# Patient Record
Sex: Female | Born: 1970 | Race: White | Hispanic: No | Marital: Single | Smoking: Current every day smoker
Health system: Southern US, Community
[De-identification: ages and names within clinical notes are randomized; demographics above are authoritative.]

## PROBLEM LIST (undated history)

## (undated) DIAGNOSIS — T7840XA Allergy, unspecified, initial encounter: Secondary | ICD-10-CM

## (undated) DIAGNOSIS — F32A Depression, unspecified: Secondary | ICD-10-CM

## (undated) DIAGNOSIS — K219 Gastro-esophageal reflux disease without esophagitis: Secondary | ICD-10-CM

## (undated) DIAGNOSIS — M1990 Unspecified osteoarthritis, unspecified site: Secondary | ICD-10-CM

## (undated) DIAGNOSIS — E119 Type 2 diabetes mellitus without complications: Secondary | ICD-10-CM

## (undated) DIAGNOSIS — G4730 Sleep apnea, unspecified: Secondary | ICD-10-CM

## (undated) DIAGNOSIS — I10 Essential (primary) hypertension: Secondary | ICD-10-CM

## (undated) DIAGNOSIS — F419 Anxiety disorder, unspecified: Secondary | ICD-10-CM

## (undated) DIAGNOSIS — E785 Hyperlipidemia, unspecified: Secondary | ICD-10-CM

## (undated) DIAGNOSIS — F329 Major depressive disorder, single episode, unspecified: Secondary | ICD-10-CM

## (undated) HISTORY — DX: Gastro-esophageal reflux disease without esophagitis: K21.9

## (undated) HISTORY — DX: Essential (primary) hypertension: I10

## (undated) HISTORY — DX: Depression, unspecified: F32.A

## (undated) HISTORY — DX: Allergy, unspecified, initial encounter: T78.40XA

## (undated) HISTORY — DX: Type 2 diabetes mellitus without complications: E11.9

## (undated) HISTORY — DX: Anxiety disorder, unspecified: F41.9

## (undated) HISTORY — DX: Hyperlipidemia, unspecified: E78.5

## (undated) HISTORY — DX: Unspecified osteoarthritis, unspecified site: M19.90

## (undated) HISTORY — DX: Sleep apnea, unspecified: G47.30

## (undated) HISTORY — PX: HERNIA REPAIR: SHX51

## (undated) HISTORY — DX: Major depressive disorder, single episode, unspecified: F32.9

---

## 2002-11-30 HISTORY — PX: ABDOMINAL HYSTERECTOMY: SHX81

## 2004-09-19 ENCOUNTER — Other Ambulatory Visit: Payer: Self-pay

## 2004-09-30 ENCOUNTER — Inpatient Hospital Stay: Payer: Self-pay | Admitting: Unknown Physician Specialty

## 2005-04-29 ENCOUNTER — Emergency Department: Payer: Self-pay | Admitting: Emergency Medicine

## 2007-11-04 ENCOUNTER — Emergency Department: Payer: Self-pay | Admitting: Internal Medicine

## 2007-11-04 ENCOUNTER — Other Ambulatory Visit: Payer: Self-pay

## 2007-12-01 HISTORY — PX: CHOLECYSTECTOMY: SHX55

## 2009-07-12 ENCOUNTER — Ambulatory Visit: Payer: Self-pay | Admitting: Family Medicine

## 2011-10-12 ENCOUNTER — Ambulatory Visit: Payer: Self-pay | Admitting: Family Medicine

## 2012-06-22 ENCOUNTER — Ambulatory Visit: Payer: Self-pay | Admitting: Specialist

## 2012-06-22 DIAGNOSIS — I10 Essential (primary) hypertension: Secondary | ICD-10-CM

## 2012-06-22 LAB — BILIRUBIN, DIRECT: Bilirubin, Direct: <0.1 mg/dL (ref 0.00–0.20)

## 2012-06-22 LAB — CBC WITH DIFFERENTIAL/PLATELET
Basophil #: 0 10*3/uL (ref 0.0–0.1)
Eosinophil %: 1.5 %
Lymphocyte #: 2 10*3/uL (ref 1.0–3.6)
Lymphocyte %: 43.6 %
MCV: 95 fL (ref 80–100)
Monocyte #: 0.3 x10 3/mm (ref 0.2–0.9)
Monocyte %: 7 %
Neutrophil %: 47.3 %
Platelet: 242 10*3/uL (ref 150–440)
RBC: 4.6 10*6/uL (ref 3.80–5.20)
RDW: 12.4 % (ref 11.5–14.5)
WBC: 4.7 10*3/uL (ref 3.6–11.0)

## 2012-06-22 LAB — COMPREHENSIVE METABOLIC PANEL
Albumin: 3.9 g/dL (ref 3.4–5.0)
Alkaline Phosphatase: 139 U/L — ABNORMAL HIGH (ref 50–136)
Anion Gap: 6 — ABNORMAL LOW (ref 7–16)
BUN: 8 mg/dL (ref 7–18)
Bilirubin,Total: 0.4 mg/dL (ref 0.2–1.0)
Calcium, Total: 9 mg/dL (ref 8.5–10.1)
Chloride: 108 mmol/L — ABNORMAL HIGH (ref 98–107)
Glucose: 104 mg/dL — ABNORMAL HIGH (ref 65–99)
Osmolality: 282 (ref 275–301)
Potassium: 3.8 mmol/L (ref 3.5–5.1)
SGOT(AST): 31 U/L (ref 15–37)
Sodium: 142 mmol/L (ref 136–145)
Total Protein: 7.4 g/dL (ref 6.4–8.2)

## 2012-06-22 LAB — LIPASE, BLOOD: Lipase: 193 U/L (ref 73–393)

## 2012-06-22 LAB — TSH: Thyroid Stimulating Horm: 1.33 u[IU]/mL

## 2012-06-22 LAB — FERRITIN: Ferritin (ARMC): 43 ng/mL (ref 8–388)

## 2012-06-22 LAB — PROTIME-INR
INR: 0.9
Prothrombin Time: 12.4 secs (ref 11.5–14.7)

## 2012-06-22 LAB — IRON AND TIBC
Iron Saturation: 31 %
Iron: 115 ug/dL (ref 50–170)

## 2012-06-22 LAB — HEMOGLOBIN A1C: Hemoglobin A1C: 6.4 % — ABNORMAL HIGH (ref 4.2–6.3)

## 2012-06-22 LAB — APTT: Activated PTT: 27.8 secs (ref 23.6–35.9)

## 2012-06-22 LAB — PHOSPHORUS: Phosphorus: 3.8 mg/dL (ref 2.5–4.9)

## 2012-06-29 ENCOUNTER — Ambulatory Visit: Payer: Self-pay | Admitting: Bariatrics

## 2012-06-30 ENCOUNTER — Ambulatory Visit: Payer: Self-pay | Admitting: Bariatrics

## 2012-07-26 ENCOUNTER — Ambulatory Visit: Payer: Self-pay | Admitting: Specialist

## 2012-08-02 ENCOUNTER — Inpatient Hospital Stay: Payer: Self-pay | Admitting: Bariatrics

## 2012-08-02 HISTORY — PX: SLEEVE GASTROPLASTY: SHX1101

## 2012-08-03 LAB — CBC WITH DIFFERENTIAL/PLATELET
Basophil #: 0 10*3/uL (ref 0.0–0.1)
Basophil %: 0.1 %
Eosinophil #: 0 10*3/uL (ref 0.0–0.7)
Eosinophil %: 0.2 %
HCT: 38.2 % (ref 35.0–47.0)
Lymphocyte #: 2 10*3/uL (ref 1.0–3.6)
Lymphocyte %: 17.8 %
MCV: 93 fL (ref 80–100)
Neutrophil #: 8.6 10*3/uL — ABNORMAL HIGH (ref 1.4–6.5)
Neutrophil %: 74.5 %
RBC: 4.1 10*6/uL (ref 3.80–5.20)
WBC: 11.5 10*3/uL — ABNORMAL HIGH (ref 3.6–11.0)

## 2012-08-03 LAB — MAGNESIUM: Magnesium: 2 mg/dL

## 2012-08-03 LAB — BASIC METABOLIC PANEL
Anion Gap: 5 — ABNORMAL LOW (ref 7–16)
BUN: 9 mg/dL (ref 7–18)
Chloride: 105 mmol/L (ref 98–107)
Creatinine: 1.04 mg/dL (ref 0.60–1.30)
EGFR (African American): >60
EGFR (Non-African Amer.): >60
Glucose: 115 mg/dL — ABNORMAL HIGH (ref 65–99)
Osmolality: 277 (ref 275–301)

## 2012-08-03 LAB — PHOSPHORUS: Phosphorus: 4.3 mg/dL (ref 2.5–4.9)

## 2012-08-25 ENCOUNTER — Ambulatory Visit: Payer: Self-pay | Admitting: Specialist

## 2012-08-30 ENCOUNTER — Ambulatory Visit: Payer: Self-pay | Admitting: Specialist

## 2012-10-20 ENCOUNTER — Ambulatory Visit: Payer: Self-pay

## 2012-10-30 ENCOUNTER — Ambulatory Visit: Payer: Self-pay | Admitting: Bariatrics

## 2012-11-14 ENCOUNTER — Ambulatory Visit: Payer: Self-pay | Admitting: Family Medicine

## 2013-09-01 IMAGING — CR DG CHEST 2V
1 series · 2 of 2 positions shown · non-contrast
Comparison: none

REASON FOR EXAM: Hypertension, hyperlipidemia, morbid obesity, insomnia,
depression
COMMENTS:

PROCEDURE:     DXR - DXR CHEST PA (OR AP) AND LATERAL  - June 22, 2012  [DATE]
RESULT:     Comparison: None

[Series 1: w chest pa · 0.14mm/px · 2 of 2 slices shown]
[im 1/2]
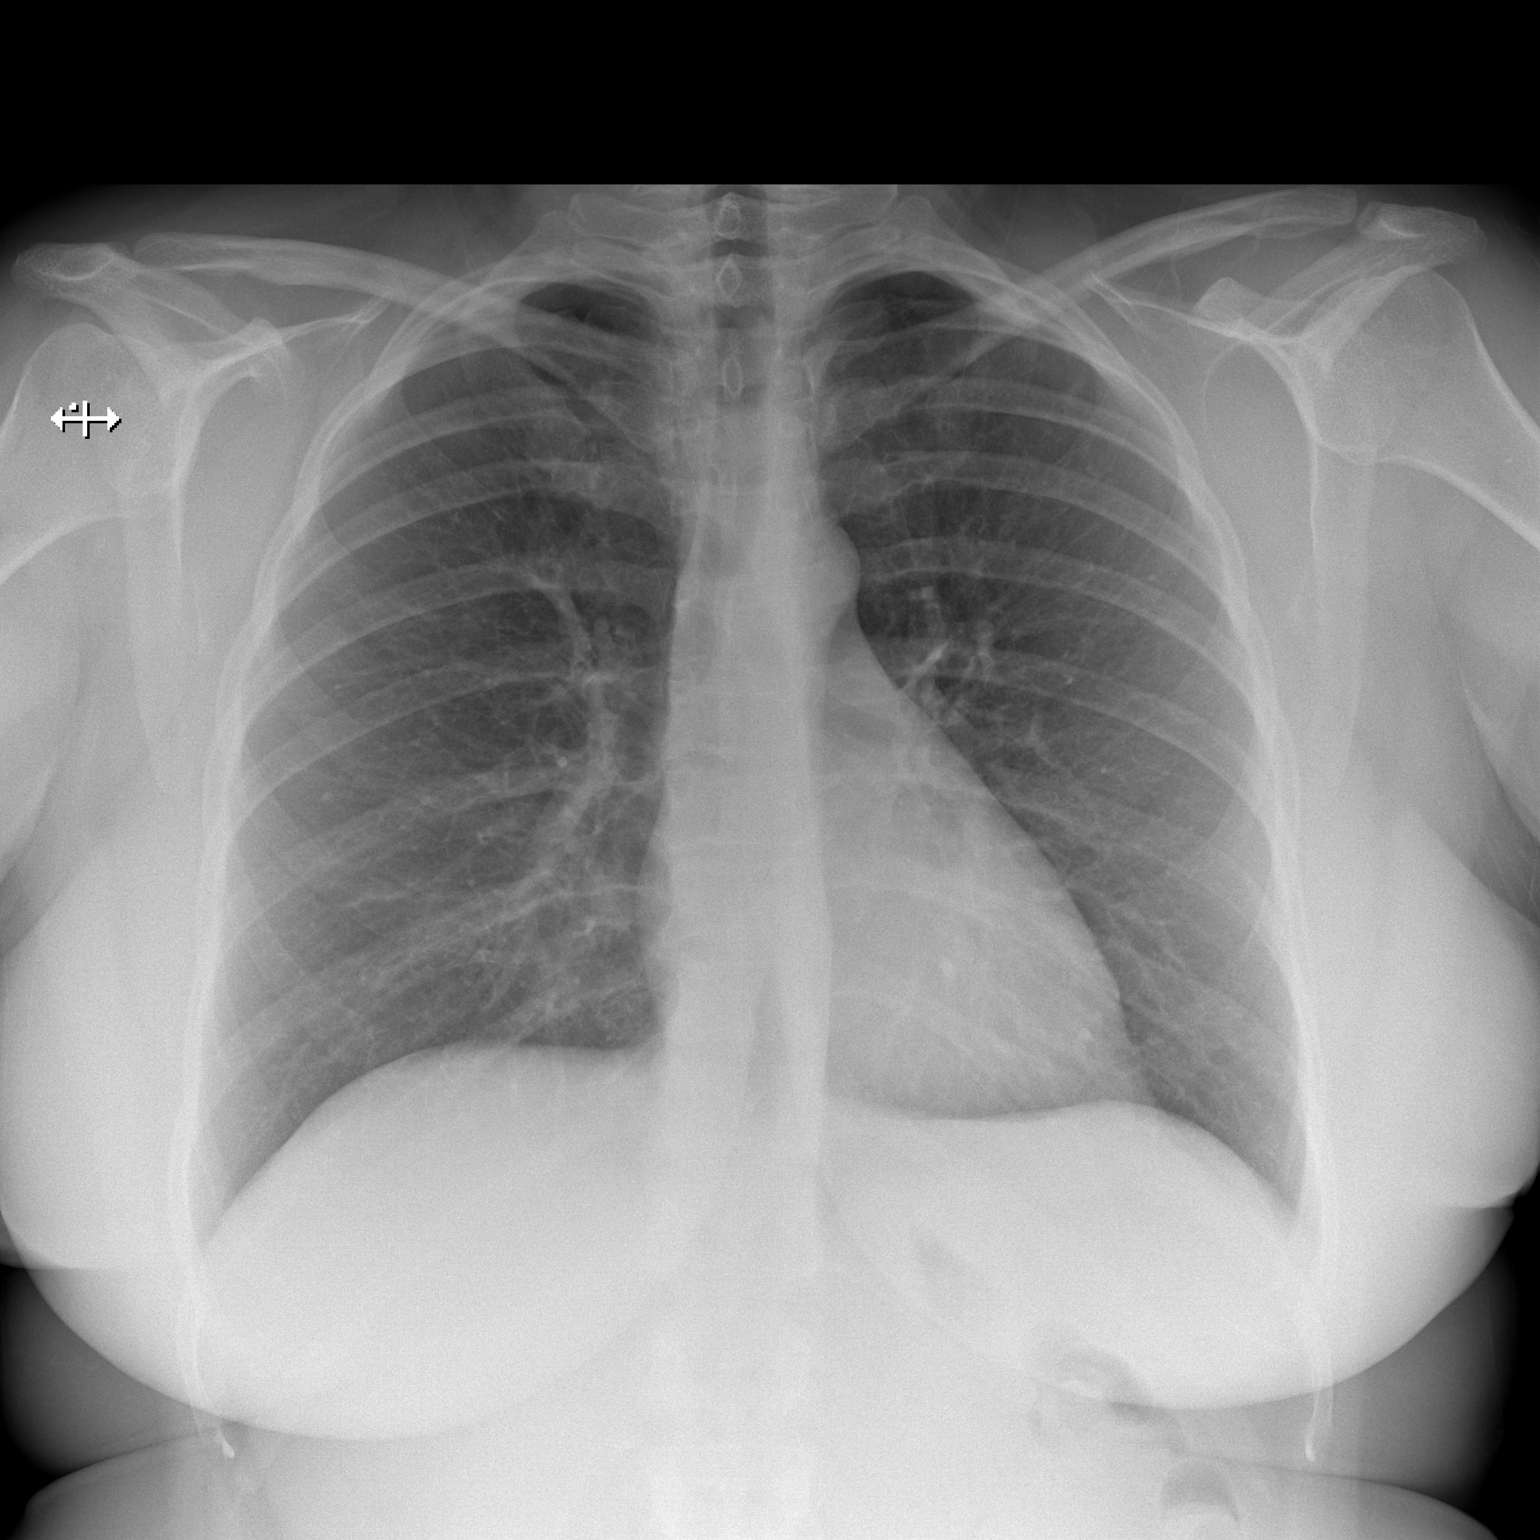
[im 2/2]
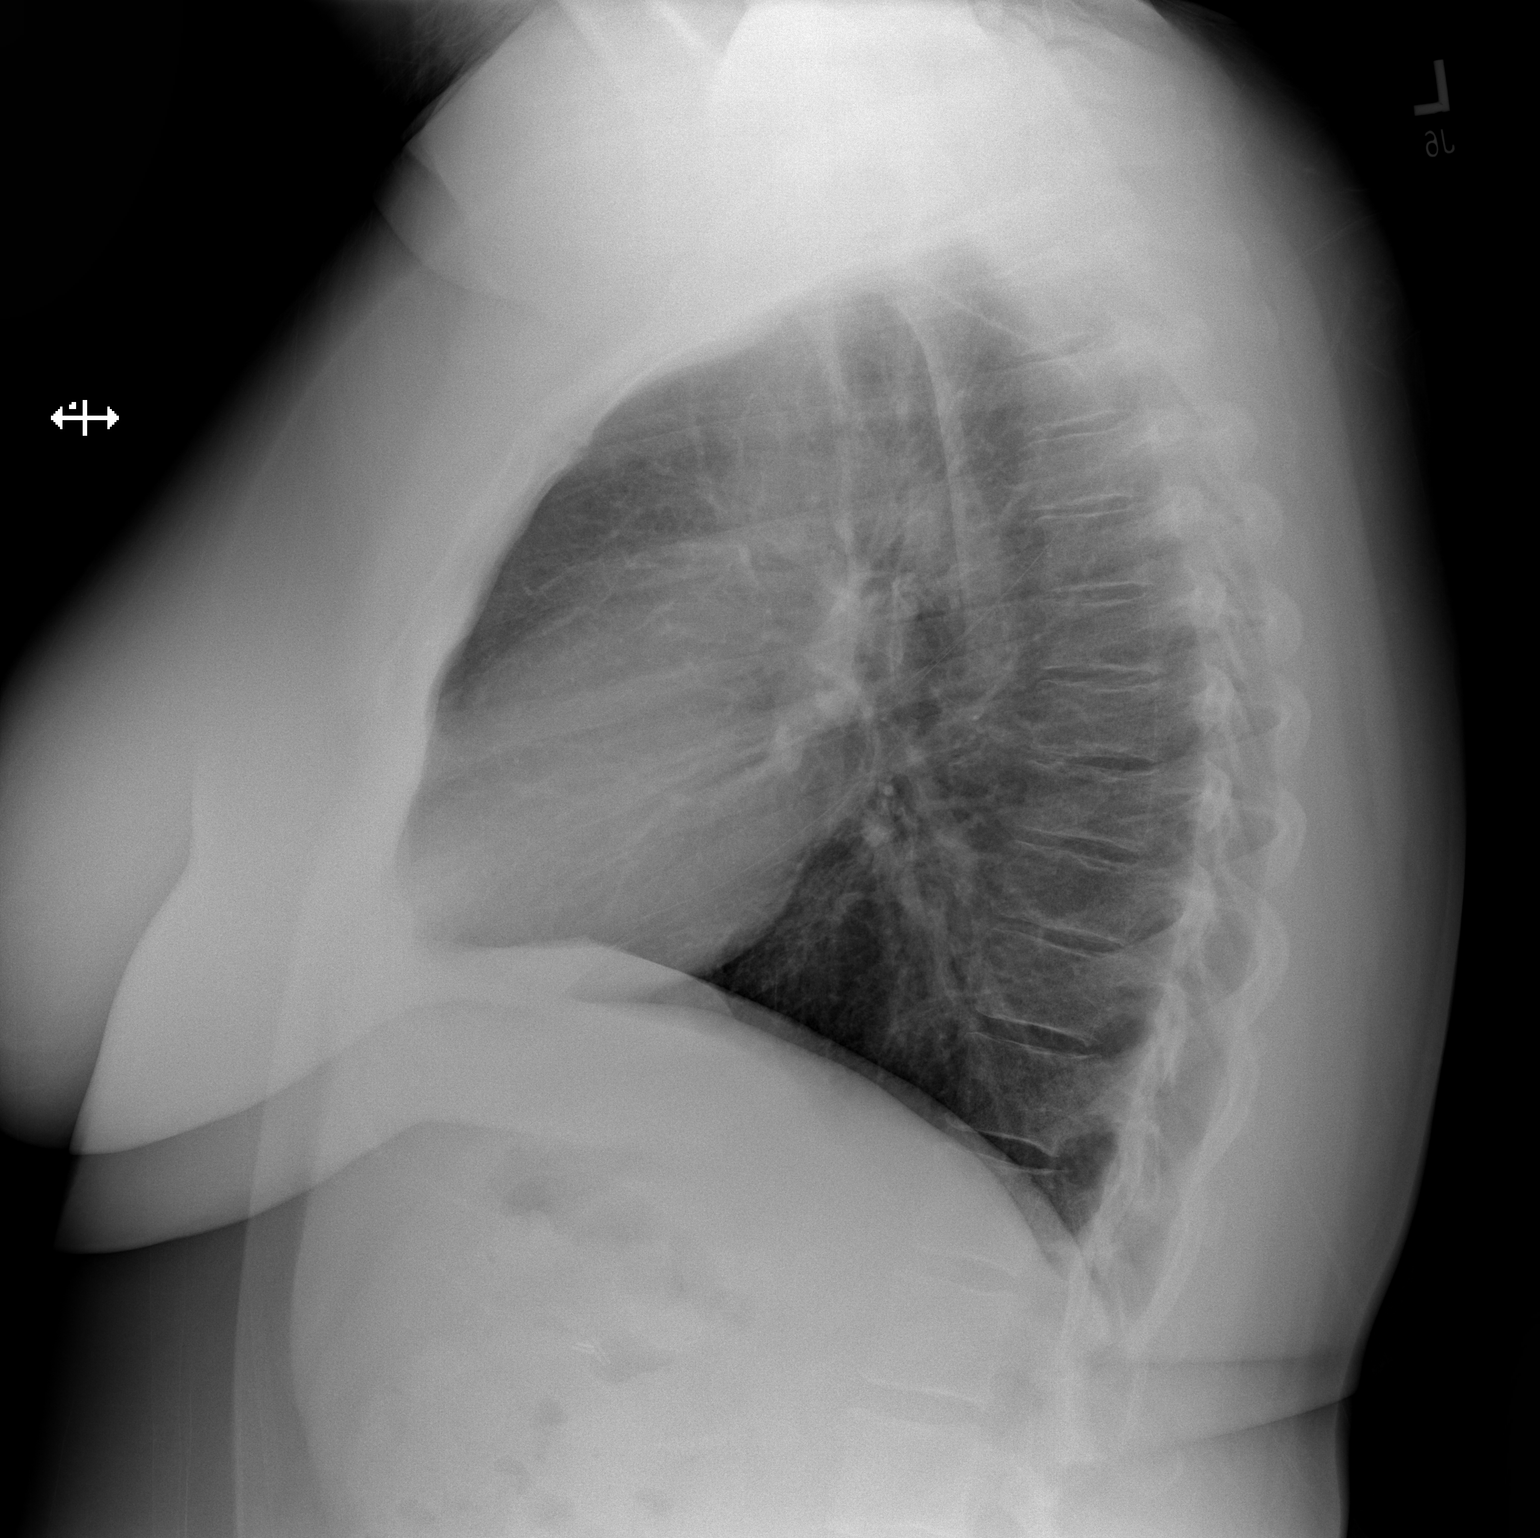

[2 of 2 positions shown; findings below may reference images not displayed]

FINDINGS: PA and lateral chest radiographs are provided.  There is no focal
parenchymal opacity, pleural effusion, or pneumothorax. The heart and
mediastinum are unremarkable.  The osseous structures are unremarkable.
IMPRESSION: No acute disease of the che[REDACTED]

## 2014-02-21 ENCOUNTER — Ambulatory Visit: Payer: BC Managed Care – PPO | Admitting: Adult Health

## 2014-02-26 ENCOUNTER — Encounter: Payer: Self-pay | Admitting: Family Medicine

## 2014-02-26 ENCOUNTER — Ambulatory Visit (INDEPENDENT_AMBULATORY_CARE_PROVIDER_SITE_OTHER): Payer: BC Managed Care – PPO | Admitting: Family Medicine

## 2014-02-26 VITALS — BP 112/70 | HR 79 | Temp 97.7°F | Ht 66.5 in | Wt 228.8 lb

## 2014-02-26 DIAGNOSIS — E119 Type 2 diabetes mellitus without complications: Secondary | ICD-10-CM | POA: Insufficient documentation

## 2014-02-26 DIAGNOSIS — E1169 Type 2 diabetes mellitus with other specified complication: Secondary | ICD-10-CM | POA: Insufficient documentation

## 2014-02-26 DIAGNOSIS — I10 Essential (primary) hypertension: Secondary | ICD-10-CM

## 2014-02-26 DIAGNOSIS — F418 Other specified anxiety disorders: Secondary | ICD-10-CM

## 2014-02-26 DIAGNOSIS — R7309 Other abnormal glucose: Secondary | ICD-10-CM

## 2014-02-26 DIAGNOSIS — K219 Gastro-esophageal reflux disease without esophagitis: Secondary | ICD-10-CM

## 2014-02-26 DIAGNOSIS — Z23 Encounter for immunization: Secondary | ICD-10-CM

## 2014-02-26 DIAGNOSIS — F341 Dysthymic disorder: Secondary | ICD-10-CM

## 2014-02-26 DIAGNOSIS — F17200 Nicotine dependence, unspecified, uncomplicated: Secondary | ICD-10-CM | POA: Insufficient documentation

## 2014-02-26 DIAGNOSIS — R7303 Prediabetes: Secondary | ICD-10-CM | POA: Insufficient documentation

## 2014-02-26 DIAGNOSIS — R739 Hyperglycemia, unspecified: Secondary | ICD-10-CM

## 2014-02-26 DIAGNOSIS — E785 Hyperlipidemia, unspecified: Secondary | ICD-10-CM

## 2014-02-26 MED ORDER — RANITIDINE HCL 150 MG PO CAPS: 150.0000 mg | ORAL_CAPSULE | Freq: Two times a day (BID) | ORAL | Status: DC

## 2014-02-26 MED ORDER — ESCITALOPRAM OXALATE 20 MG PO TABS: 20.0000 mg | ORAL_TABLET | Freq: Every day | ORAL | Status: DC

## 2014-02-26 MED ORDER — CLONAZEPAM 1 MG PO TABS: 1.0000 mg | ORAL_TABLET | Freq: Two times a day (BID) | ORAL | Status: DC | PRN

## 2014-02-26 NOTE — Assessment & Plan Note (Signed)
Last lipids reviewed with pt  Well controlled with statin and diet Rev low sat fat diet Lab and f/u in the fall

## 2014-02-26 NOTE — Progress Notes (Signed)
Subjective:    Patient ID: Rebekah Benton, female    DOB: October 20, 1971, 43 y.o.   MRN: [id number]  HPI Here to get est as a new pt   Was seen by Dr Maryjane Hurter at [facility] in the past  Had bariatric surgery - sleeve gastrectomy  Did not loose as much as she hoped  She lost 40 lb in 2 years-not as much as she had hoped -now wishes she had done the bypass  She goes to [employer] for exercise   (she has not gone for about a month due to sleeplessness)  Eats healthy   Probably gained 15 lb since not going   Is having problems with sleep  This is relatively new for her  ? What changed -was in a good rhythm in the past  Has a 8 year old daughter- stressor  Son is deployed - is a Insurance risk surveyor  Works full time  Also school on line  She tends to stay tense  She also grinds her teeth at night   Has hx of depression and anxiety and has been on celexa and clonazepam  ? If poss panic disease  In the past lexapro worked well for her but then she could not afford it  She thinks they work well  If she misses a dose - gets anxiety and depression/ foul mood and a lot of worry with irritability    Hx of DM-does not currently have DM  A1C is 6.0   cmet nl also   Cholesterol  167     (at her very highest was much over 200)  Trig 100 HDL 55.3  LDL 91.7  Overall very good profile On lipitor and diet   bp is stable today  No cp or palpitations or headaches or edema  No side effects to medicines  BP Readings from Last 3 Encounters:  02/26/14 112/70       Had a hysterectomy in the past - due to very bad peroids  A year later had oophrectomy -had a lot of cysts on them   Had a mammogram in 10/13  Was normal  She goes to [facility] Not ready to quit No lung problems  Does not get flu shots   Patient Active Problem List   Diagnosis Date Noted  . Depression with anxiety 02/26/2014  . Hyperglycemia 02/26/2014  . Essential hypertension, benign  02/26/2014  . Hyperlipidemia 02/26/2014  . Smoking 02/26/2014  . GERD (gastroesophageal reflux disease) 02/26/2014   Past Medical History  Diagnosis Date  . Depression   . Diabetes mellitus without complication   . Hypertension   . Hyperlipidemia   . GERD (gastroesophageal reflux disease)    Past Surgical History  Procedure Laterality Date  . Abdominal hysterectomy  2004  . Cholecystectomy  2009  . Sleeve gastroplasty  08/02/2012   History  Substance Use Topics  . Smoking status: Current Every Day Smoker  . Smokeless tobacco: Never Used  . Alcohol Use: No   Family History  Problem Relation Age of Onset  . Cancer Father     Prostate  . Diabetes Father    Allergies  Allergen Reactions  . Penicillins Rash   No current outpatient prescriptions on file prior to visit.   No current facility-administered medications on file prior to visit.    Review of Systems Review of Systems  Constitutional: Negative for fever, appetite change, fatigue and unexpected weight change.  Eyes: Negative for  pain and visual disturbance.  Respiratory: Negative for cough and shortness of breath.   Cardiovascular: Negative for cp or palpitations    Gastrointestinal: Negative for nausea, diarrhea and constipation.  Genitourinary: Negative for urgency and frequency.  Skin: Negative for pallor or rash   Neurological: Negative for weakness, light-headedness, numbness and headaches.  Hematological: Negative for adenopathy. Does not bruise/bleed easily.  Psychiatric/Behavioral: Negative for dysphoric mood. The patient is not nervous/anxious.  pos for stressors and also difficulty obt and mt sleep       Objective:   Physical Exam  Constitutional: She appears well-developed and well-nourished. No distress.  obese and well appearing   HENT:  Head: Normocephalic and atraumatic.  Right Ear: External ear normal.  Left Ear: External ear normal.  Nose: Nose normal.  Mouth/Throat: Oropharynx is clear  and moist.  Eyes: Conjunctivae and EOM are normal. Pupils are equal, round, and reactive to light. Right eye exhibits no discharge. Left eye exhibits no discharge. No scleral icterus.  Neck: Normal range of motion. Neck supple. No JVD present. No thyromegaly present.  Cardiovascular: Normal rate, regular rhythm, normal heart sounds and intact distal pulses.  Exam reveals no gallop.   Pulmonary/Chest: Effort normal and breath sounds normal. No respiratory distress. She has no wheezes. She has no rales.  Abdominal: Soft. Bowel sounds are normal. She exhibits no distension and no mass. There is no tenderness.  Musculoskeletal: She exhibits no edema and no tenderness.  Lymphadenopathy:    She has no cervical adenopathy.  Neurological: She is alert. She has normal reflexes. No cranial nerve deficit. She exhibits normal muscle tone. Coordination normal.  Skin: Skin is warm and dry. No rash noted. No erythema. No pallor.  Ruddy complexion with lentigos   Psychiatric: She has a normal mood and affect.  Pleasant and attentive today           Assessment & Plan:

## 2014-02-26 NOTE — Patient Instructions (Signed)
[facility] and see if you can afford the generic  If so -stop the celexa generic and change to lexapro generic Try ranitidine for your acid reflux  My hope- you will sleep better with lexapro  If you want a counseling referral in the future -please call and let me know  Don't forget to schedule your own mammogram Keep thinking about quitting smoking  Tetanus shot today    Schedule annual exam in the fall (Sept) with labs prior

## 2014-02-26 NOTE — Assessment & Plan Note (Signed)
Prev DM before bariatric surg Re check A1C in fall  6.0 last check Rev low glycemic diet/ goals for wt loss and exercise

## 2014-02-26 NOTE — Assessment & Plan Note (Signed)
bp in fair control at this time  BP Readings from Last 1 Encounters:  02/26/14 112/70   No changes needed Disc lifstyle change with low sodium diet and exercise   [facility] the [facility] F/u fall for PE with labs

## 2014-02-26 NOTE — Progress Notes (Signed)
Pre visit review using our clinic review tool, if applicable. No additional management support is needed unless otherwise documented below in the visit note. 

## 2014-02-26 NOTE — Assessment & Plan Note (Signed)
Disc in detail risks of smoking and possible outcomes including copd, vascular/ heart disease, cancer , respiratory and sinus infections  Pt voices understanding She states she is not ready to quit yet- disc opt for help when she is

## 2014-02-26 NOTE — Assessment & Plan Note (Signed)
While this is fairly controlled - she has many stressors and trouble sleeping Reviewed stressors/ coping techniques/symptoms/ support sources/ tx options and side effects in detail today  Pt did better on lexapro in the past - will see if the generic is affordable for her now and switch from celexa if she can Disc sleep hygiene and need for exercise Also caffeine avoidance  F/u planned for fall- earlier if needed

## 2014-02-26 NOTE — Assessment & Plan Note (Signed)
Worse lately -? With recent wt re gain Also a bariatric surgery pt  Disc gerd diet  Omeprazole does not work well for her  Will try ranitidine 150 bid and update

## 2014-07-10 ENCOUNTER — Other Ambulatory Visit: Payer: Self-pay | Admitting: *Deleted

## 2014-07-10 NOTE — Telephone Encounter (Signed)
Fax refill request, not on med list, please advise  

## 2014-07-10 NOTE — Telephone Encounter (Signed)
Please clarify with pt that she is taking this

## 2014-07-13 ENCOUNTER — Encounter: Payer: Self-pay | Admitting: Radiology

## 2014-07-13 ENCOUNTER — Ambulatory Visit (INDEPENDENT_AMBULATORY_CARE_PROVIDER_SITE_OTHER)
Admission: RE | Admit: 2014-07-13 | Discharge: 2014-07-13 | Disposition: A | Discharge status: Home / Self Care | Payer: BC Managed Care – PPO | Source: Ambulatory Visit | Attending: Family Medicine | Admitting: Family Medicine

## 2014-07-13 ENCOUNTER — Ambulatory Visit (INDEPENDENT_AMBULATORY_CARE_PROVIDER_SITE_OTHER): Payer: BC Managed Care – PPO | Admitting: Family Medicine

## 2014-07-13 ENCOUNTER — Encounter: Payer: Self-pay | Admitting: Family Medicine

## 2014-07-13 ENCOUNTER — Other Ambulatory Visit: Payer: Self-pay | Admitting: *Deleted

## 2014-07-13 VITALS — BP 122/88 | HR 80 | Temp 98.4°F | Ht 66.5 in | Wt 235.8 lb

## 2014-07-13 DIAGNOSIS — M25569 Pain in unspecified knee: Secondary | ICD-10-CM

## 2014-07-13 DIAGNOSIS — M25561 Pain in right knee: Secondary | ICD-10-CM | POA: Insufficient documentation

## 2014-07-13 DIAGNOSIS — M79671 Pain in right foot: Secondary | ICD-10-CM

## 2014-07-13 DIAGNOSIS — M79609 Pain in unspecified limb: Secondary | ICD-10-CM

## 2014-07-13 DIAGNOSIS — M25579 Pain in unspecified ankle and joints of unspecified foot: Secondary | ICD-10-CM

## 2014-07-13 DIAGNOSIS — M25571 Pain in right ankle and joints of right foot: Secondary | ICD-10-CM

## 2014-07-13 NOTE — Progress Notes (Signed)
Subjective:    Patient ID: Rebekah Benton, female    DOB: 1971/08/01, 43 y.o.   MRN: [id number]  HPI Here with pain in her R foot and knee  Going on for months  No injury known   Usually works out and walks (does some sprints on a track) Has not walked all week due to the pain  Coming home - putting ice on her foot and knee  Cannot get comfortable   She wonders about arthritis in knees   (other knee hurts a bit now)   Foot hurts laterally It swells at times  Very little brusing if any  Will ache and radiate up leg  Knee hurts over the patellar tendon and to both sides  Bending hurts (squats are the worst)   Ibuprofen helped once   Patient Active Problem List   Diagnosis Date Noted  . Depression with anxiety 02/26/2014  . Hyperglycemia 02/26/2014  . Essential hypertension, benign 02/26/2014  . Hyperlipidemia 02/26/2014  . Smoking 02/26/2014  . GERD (gastroesophageal reflux disease) 02/26/2014   Past Medical History  Diagnosis Date  . Depression   . Diabetes mellitus without complication   . Hypertension   . Hyperlipidemia   . GERD (gastroesophageal reflux disease)    Past Surgical History  Procedure Laterality Date  . Abdominal hysterectomy  2004  . Cholecystectomy  2009  . Sleeve gastroplasty  08/02/2012   History  Substance Use Topics  . Smoking status: Current Every Day Smoker -- 0.50 packs/day    Types: Cigarettes  . Smokeless tobacco: Never Used  . Alcohol Use: No   Family History  Problem Relation Age of Onset  . Cancer Father     Prostate  . Diabetes Father    Allergies  Allergen Reactions  . Penicillins Rash   Current Outpatient Prescriptions on File Prior to Visit  Medication Sig Dispense Refill  . atorvastatin (LIPITOR) 40 MG tablet Take 40 mg by mouth daily.      . citalopram (CELEXA) 40 MG tablet Take 40 mg by mouth daily.      . clonazePAM (KLONOPIN) 1 MG tablet Take 1 tablet (1 mg total) by mouth 2 (two) times daily as needed for  anxiety.  60 tablet  1  . escitalopram (LEXAPRO) 20 MG tablet Take 1 tablet (20 mg total) by mouth daily.  30 tablet  11  . lisinopril (PRINIVIL,ZESTRIL) 10 MG tablet Take 10 mg by mouth daily.      . metoprolol (LOPRESSOR) 50 MG tablet Take 50 mg by mouth daily.      . ranitidine (ZANTAC) 150 MG capsule Take 1 capsule (150 mg total) by mouth 2 (two) times daily.  60 capsule  11   No current facility-administered medications on file prior to visit.    Review of Systems Review of Systems  Constitutional: Negative for fever, appetite change, fatigue and unexpected weight change.  Eyes: Negative for pain and visual disturbance.  Respiratory: Negative for cough and shortness of breath.   Cardiovascular: Negative for cp or palpitations    Gastrointestinal: Negative for nausea, diarrhea and constipation.  Genitourinary: Negative for urgency and frequency.  Skin: Negative for pallor or rash  MSK pos for foot/ankle and knee pain   Neurological: Negative for weakness, light-headedness, numbness and headaches.  Hematological: Negative for adenopathy. Does not bruise/bleed easily.  Psychiatric/Behavioral: Negative for dysphoric mood. The patient is not nervous/anxious.         Objective:   Physical Exam  Constitutional: She appears well-developed and well-nourished. No distress.  obese and well appearing   HENT:  Head: Normocephalic and atraumatic.  Neck: Normal range of motion. Neck supple.  Cardiovascular: Normal rate and regular rhythm.   Pulmonary/Chest: Effort normal and breath sounds normal.  Musculoskeletal: She exhibits edema and tenderness.       Right knee: She exhibits decreased range of motion and swelling. She exhibits no effusion, no ecchymosis, no erythema, normal patellar mobility, no bony tenderness and no MCL laxity. Tenderness found. Medial joint line and patellar tendon tenderness noted.       Right ankle: She exhibits decreased range of motion and swelling. She exhibits  no ecchymosis, no deformity and normal pulse. Tenderness. Lateral malleolus and posterior TFL tenderness found. No head of 5th metatarsal and no proximal fibula tenderness found. Achilles tendon normal.       Left foot: She exhibits tenderness, bony tenderness and swelling. She exhibits normal range of motion, no crepitus and no deformity.  R foot - lateral tenderness under malleolus  slt swelling  Pain to fully dorsiflex foot and to internally rotate it  Neg talar tilt  R knee-pain to flex over 90 deg  Pain on mcmurray test  Nl extension    Neurological: She is alert. She has normal reflexes. She displays no atrophy. No sensory deficit. She exhibits normal muscle tone.  Skin: Skin is warm and dry. No rash noted.  Psychiatric: She has a normal mood and affect.          Assessment & Plan:   Problem List Items Addressed This Visit     Other   Right foot pain - Primary     Lateral Also gait change due to knee inj Suspect overuse  Want to r/o stress fx (smoker/ obese) Xray today Aleve prn Ice and elevated Then make plan    Relevant Orders      DG Foot Complete Right (Completed)      DG Ankle Complete Right (Completed)   Right knee pain     Patellar tendon and medial joint line Suspect overuse injury in overwt female Will get a patellar stabilizing knee sleeve  Ice and elevation  Aleve prn  Xray today and then make plan    Relevant Orders      DG Knee Complete 4 Views Right (Completed)   Right ankle pain     Medial- with medial foot pain  Suspect overuse injury (also knee pain and obesity)  Cannot r/o stress fx  Xray today Aleve - 1 pill bid prn  Elevate/ice     Relevant Orders      DG Foot Complete Right (Completed)      DG Ankle Complete Right (Completed)

## 2014-07-13 NOTE — Assessment & Plan Note (Signed)
Lateral Also gait change due to knee inj Suspect overuse  Want to r/o stress fx (smoker/ obese) Xray today Aleve prn Ice and elevated Then make plan

## 2014-07-13 NOTE — Progress Notes (Signed)
Pre visit review using our clinic review tool, if applicable. No additional management support is needed unless otherwise documented below in the visit note. 

## 2014-07-13 NOTE — Assessment & Plan Note (Signed)
Medial- with medial foot pain  Suspect overuse injury (also knee pain and obesity)  Cannot r/o stress fx  Xray today Aleve - 1 pill bid prn  Elevate/ice

## 2014-07-13 NOTE — Patient Instructions (Signed)
Continue to use ice on knee and ankle whenever you can (and elevate it ) Avoid running or other things that make it hurt  Bike or swimming is best  xrays today  Try aleve 1 pill with food twice daily for pain and inflammation Get a knee sleeve (the kind with hole in the middle)- for support while active  Will make plan based on xray report

## 2014-07-13 NOTE — Assessment & Plan Note (Signed)
Patellar tendon and medial joint line Suspect overuse injury in overwt female Will get a patellar stabilizing knee sleeve  Ice and elevation  Aleve prn  Xray today and then make plan

## 2014-07-16 ENCOUNTER — Telehealth: Payer: Self-pay | Admitting: Family Medicine

## 2014-07-16 NOTE — Telephone Encounter (Signed)
Appt scheduled 08/16/14 with Dr. Patsy Lager.

## 2014-07-20 ENCOUNTER — Telehealth: Payer: Self-pay | Admitting: *Deleted

## 2014-07-20 NOTE — Telephone Encounter (Signed)
Please call pt to clarify this

## 2014-07-20 NOTE — Telephone Encounter (Signed)
Received request for lasix 20 mg 1 QD. Not on current med list. Previously prescribed [facility]aus[facility] [place]

## 2014-07-25 ENCOUNTER — Encounter: Payer: Self-pay | Admitting: *Deleted

## 2014-07-27 NOTE — Telephone Encounter (Signed)
Called pt an no answer and no voicemail set up

## 2014-08-02 MED ORDER — FUROSEMIDE 20 MG PO TABS: 20.0000 mg | ORAL_TABLET | Freq: Every day | ORAL | Status: DC

## 2014-08-02 NOTE — Telephone Encounter (Addendum)
Pt not on med when she was seen in office because she had ran out and didn't think she would need it anymore but she does, Rx done

## 2014-08-02 NOTE — Addendum Note (Signed)
Addended by: Shon Millet on: 08/02/2014 04:17 PM   Modules accepted: Orders

## 2014-08-07 ENCOUNTER — Encounter: Payer: Self-pay | Admitting: Family Medicine

## 2014-08-08 ENCOUNTER — Telehealth: Payer: Self-pay | Admitting: Family Medicine

## 2014-08-08 DIAGNOSIS — E785 Hyperlipidemia, unspecified: Secondary | ICD-10-CM

## 2014-08-08 DIAGNOSIS — I10 Essential (primary) hypertension: Secondary | ICD-10-CM

## 2014-08-08 DIAGNOSIS — R739 Hyperglycemia, unspecified: Secondary | ICD-10-CM

## 2014-08-08 NOTE — Telephone Encounter (Signed)
Pt was supposed to have labs and then PE scheduled -I do not see a PE appt

## 2014-08-09 ENCOUNTER — Encounter: Payer: Self-pay | Admitting: Family Medicine

## 2014-08-16 ENCOUNTER — Other Ambulatory Visit: Payer: BC Managed Care – PPO

## 2014-08-16 ENCOUNTER — Institutional Professional Consult (permissible substitution): Payer: BC Managed Care – PPO | Admitting: Family Medicine

## 2014-09-18 ENCOUNTER — Other Ambulatory Visit: Payer: Self-pay

## 2014-09-18 NOTE — Telephone Encounter (Signed)
Please schedule PE -winter or spring and refill until then

## 2014-09-18 NOTE — Telephone Encounter (Signed)
Pt left v/m;pt request refill atorvastatin, lisinopril and metoprolol to [facility] [address] today. Dr Milinda Antisower has never filled before and 08/08/14 phone note Dr Milinda Antisower noted pt has not scheduled her CPE.Please advise.Pt request cb when refilled.

## 2014-09-19 ENCOUNTER — Telehealth: Payer: Self-pay | Admitting: *Deleted

## 2014-09-19 DIAGNOSIS — R739 Hyperglycemia, unspecified: Secondary | ICD-10-CM

## 2014-09-19 DIAGNOSIS — I10 Essential (primary) hypertension: Secondary | ICD-10-CM

## 2014-09-19 DIAGNOSIS — E785 Hyperlipidemia, unspecified: Secondary | ICD-10-CM

## 2014-09-19 MED ORDER — ATORVASTATIN CALCIUM 40 MG PO TABS: 40.0000 mg | ORAL_TABLET | Freq: Every day | ORAL | Status: DC

## 2014-09-19 MED ORDER — LISINOPRIL 10 MG PO TABS: 10.0000 mg | ORAL_TABLET | Freq: Every day | ORAL | Status: DC

## 2014-09-19 MED ORDER — METOPROLOL TARTRATE 50 MG PO TABS: 50.0000 mg | ORAL_TABLET | Freq: Every day | ORAL | Status: DC

## 2014-09-19 NOTE — Telephone Encounter (Signed)
I will put in wellness orders

## 2014-09-19 NOTE — Telephone Encounter (Signed)
appt scheduled and med refilled 

## 2014-09-19 NOTE — Telephone Encounter (Signed)
I scheduled a CPE for pt because she needed her meds refilled and you advise me she would be due for CPE in spring. appt scheduled for March but pt wanted to know if she could her her labs done now, pt said you advise her she would be due for labs in fall, is it okay to schedule appt and if so please put in lab orders

## 2014-09-20 NOTE — Addendum Note (Signed)
Addended by: Alvina ChouWALSH, TERRI J on: 09/20/2014 08:58 AM   Modules accepted: Orders

## 2014-10-30 ENCOUNTER — Other Ambulatory Visit: Payer: Self-pay | Admitting: Family Medicine

## 2014-10-30 NOTE — Telephone Encounter (Signed)
Px written for call in   

## 2014-10-30 NOTE — Telephone Encounter (Signed)
Ok to refill 

## 2014-10-31 NOTE — Telephone Encounter (Signed)
Rx called in as prescribed 

## 2014-12-28 ENCOUNTER — Encounter: Payer: Self-pay | Admitting: Family Medicine

## 2015-02-08 ENCOUNTER — Other Ambulatory Visit (INDEPENDENT_AMBULATORY_CARE_PROVIDER_SITE_OTHER): Payer: BLUE CROSS/BLUE SHIELD

## 2015-02-08 DIAGNOSIS — R739 Hyperglycemia, unspecified: Secondary | ICD-10-CM

## 2015-02-08 DIAGNOSIS — I10 Essential (primary) hypertension: Secondary | ICD-10-CM

## 2015-02-08 DIAGNOSIS — E785 Hyperlipidemia, unspecified: Secondary | ICD-10-CM

## 2015-02-08 LAB — CBC WITH DIFFERENTIAL/PLATELET
Basophils Absolute: 0 10*3/uL (ref 0.0–0.1)
Basophils Relative: 0.5 % (ref 0.0–3.0)
EOS PCT: 2 % (ref 0.0–5.0)
Eosinophils Absolute: 0.1 10*3/uL (ref 0.0–0.7)
HCT: 42.6 % (ref 36.0–46.0)
Hemoglobin: 14.7 g/dL (ref 12.0–15.0)
Lymphocytes Relative: 38.5 % (ref 12.0–46.0)
Lymphs Abs: 2.3 10*3/uL (ref 0.7–4.0)
MCHC: 34.5 g/dL (ref 30.0–36.0)
MCV: 91.2 fl (ref 78.0–100.0)
MONO ABS: 0.4 10*3/uL (ref 0.1–1.0)
MONOS PCT: 7.2 % (ref 3.0–12.0)
NEUTROS PCT: 51.8 % (ref 43.0–77.0)
Neutro Abs: 3.1 10*3/uL (ref 1.4–7.7)
Platelets: 262 10*3/uL (ref 150.0–400.0)
RBC: 4.67 Mil/uL (ref 3.87–5.11)
RDW: 12.8 % (ref 11.5–15.5)
WBC: 5.9 10*3/uL (ref 4.0–10.5)

## 2015-02-08 LAB — COMPREHENSIVE METABOLIC PANEL
ALBUMIN: 4.3 g/dL (ref 3.5–5.2)
ALT: 30 U/L (ref 0–35)
AST: 22 U/L (ref 0–37)
Alkaline Phosphatase: 108 U/L (ref 39–117)
BUN: 15 mg/dL (ref 6–23)
CHLORIDE: 106 meq/L (ref 96–112)
CO2: 26 meq/L (ref 19–32)
Calcium: 9.7 mg/dL (ref 8.4–10.5)
Creatinine, Ser: 0.95 mg/dL (ref 0.40–1.20)
GFR: 68.14 mL/min (ref >60.00)
GLUCOSE: 102 mg/dL — AB (ref 70–99)
Potassium: 4 mEq/L (ref 3.5–5.1)
Sodium: 137 mEq/L (ref 135–145)
Total Bilirubin: 0.5 mg/dL (ref 0.2–1.2)
Total Protein: 7.1 g/dL (ref 6.0–8.3)

## 2015-02-08 LAB — LIPID PANEL
CHOLESTEROL: 289 mg/dL — AB (ref 0–200)
HDL: 57.5 mg/dL (ref >39.00)
LDL CALC: 205 mg/dL — AB (ref 0–99)
NonHDL: 231.5
TRIGLYCERIDES: 133 mg/dL (ref 0.0–149.0)
Total CHOL/HDL Ratio: 5
VLDL: 26.6 mg/dL (ref 0.0–40.0)

## 2015-02-08 LAB — TSH: TSH: 0.93 u[IU]/mL (ref 0.35–4.50)

## 2015-02-08 LAB — HEMOGLOBIN A1C: HEMOGLOBIN A1C: 6.3 % (ref 4.6–6.5)

## 2015-02-12 ENCOUNTER — Encounter: Payer: Self-pay | Admitting: Family Medicine

## 2015-02-12 ENCOUNTER — Ambulatory Visit (INDEPENDENT_AMBULATORY_CARE_PROVIDER_SITE_OTHER): Payer: BLUE CROSS/BLUE SHIELD | Admitting: Family Medicine

## 2015-02-12 VITALS — BP 108/74 | HR 76 | Temp 97.8°F | Ht 66.5 in | Wt 243.8 lb

## 2015-02-12 DIAGNOSIS — E785 Hyperlipidemia, unspecified: Secondary | ICD-10-CM

## 2015-02-12 DIAGNOSIS — R739 Hyperglycemia, unspecified: Secondary | ICD-10-CM

## 2015-02-12 DIAGNOSIS — F17200 Nicotine dependence, unspecified, uncomplicated: Secondary | ICD-10-CM

## 2015-02-12 DIAGNOSIS — Z720 Tobacco use: Secondary | ICD-10-CM

## 2015-02-12 DIAGNOSIS — Z0000 Encounter for general adult medical examination without abnormal findings: Secondary | ICD-10-CM

## 2015-02-12 DIAGNOSIS — I10 Essential (primary) hypertension: Secondary | ICD-10-CM

## 2015-02-12 MED ORDER — ATORVASTATIN CALCIUM 40 MG PO TABS: 40.0000 mg | ORAL_TABLET | Freq: Every day | ORAL | Status: DC

## 2015-02-12 NOTE — Progress Notes (Signed)
Pre visit review using our clinic review tool, if applicable. No additional management support is needed unless otherwise documented below in the visit note. 

## 2015-02-12 NOTE — Progress Notes (Signed)
Subjective:    Patient ID: Rebekah Benton, female    DOB: 28-Mar-1971, 44 y.o.   MRN: [id number]  HPI Here for health maintenance exam and to review chronic medical problems    Wt is up 8 lb with bmi of 38 By her scales - about 40 lb  Had a sleeve procedure - is thinking about revision -has appt with a surgeon (interested in gastric bypass) She "just likes good food" -still does not eat a lot  ? 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   HIV screen- had that at her last doctor -not worried about risk  Flu shot declines  Td 3/15 Mammogram has not had one since the first one , she goes  Self exam -no lumps   Hx of hysterectomy- not for cervical cancer   Hyperglycemia Lab Results  Component Value Date   HGBA1C 6.3 02/08/2015  had gone down - it is from weight gain - fell off the wagon   Smoking status - 1/2 ppd  Not ready to quit - yet ) has quit before- needs to pick her day  Usually quits cold [place] Habit/ is the problem, not the nicotine    bp is stable today  No cp or palpitations or headaches or edema  No side effects to medicines  BP Readings from Last 3 Encounters:  02/12/15 108/74  07/13/14 122/88  02/26/14 112/70      Hyperlipidemia Lab Results  Component Value Date   CHOL 289* 02/08/2015   Lab Results  Component Value Date   HDL 57.50 02/08/2015   Lab Results  Component Value Date   LDLCALC 205* 02/08/2015   Lab Results  Component Value Date   TRIG 133.0 02/08/2015   Lab Results  Component Value Date   CHOLHDL 5 02/08/2015  she is not taking her medicine     Chemistry      Component Value Date/Time   NA 137 02/08/2015 0844   K 4.0 02/08/2015 0844   CL 106 02/08/2015 0844   CO2 26 02/08/2015 0844   BUN 15 02/08/2015 0844   CREATININE 0.95 02/08/2015 0844      Component Value Date/Time     CALCIUM 9.7 02/08/2015 0844   ALKPHOS 108 02/08/2015 0844   AST 22 02/08/2015 0844   ALT 30 02/08/2015 0844   BILITOT 0.5 02/08/2015 0844      Lab Results  Component Value Date   WBC 5.9 02/08/2015   HGB 14.7 02/08/2015   HCT 42.6 02/08/2015   MCV 91.2 02/08/2015   PLT 262.0 02/08/2015     Patient Active Problem List   Diagnosis Date Noted  . Right foot pain 07/13/2014  . Right knee pain 07/13/2014  . Right ankle pain 07/13/2014  . Depression with anxiety 02/26/2014  . Hyperglycemia 02/26/2014  . Essential hypertension, benign 02/26/2014  . Hyperlipidemia 02/26/2014  . Smoking 02/26/2014  . GERD (gastroesophageal reflux disease) 02/26/2014   Past Medical History  Diagnosis Date  . Depression   . Diabetes mellitus without complication   . Hypertension   . Hyperlipidemia   . GERD (gastroesophageal reflux disease)    Past Surgical History  Procedure Laterality Date  . Abdominal hysterectomy  2004  . Cholecystectomy  2009  . Sleeve gastroplasty  08/02/2012  History  Substance Use Topics  . Smoking status: Current Every Day Smoker -- 0.50 packs/day    Types: Cigarettes  . Smokeless tobacco: Never Used  . Alcohol Use: No   Family History  Problem Relation Age of Onset  . Cancer Father     Prostate  . Diabetes Father    Allergies  Allergen Reactions  . Ampicillin Rash  . Penicillins Rash   Current Outpatient Prescriptions on File Prior to Visit  Medication Sig Dispense Refill  . atorvastatin (LIPITOR) 40 MG tablet Take 1 tablet (40 mg total) by mouth daily. 30 tablet 5  . clonazePAM (KLONOPIN) 1 MG tablet TAKE ONE TABLET BY MOUTH TWICE DAILY AS NEEDED FOR ANXIETY 60 tablet 0  . escitalopram (LEXAPRO) 20 MG tablet Take 1 tablet (20 mg total) by mouth daily. 30 tablet 11  . furosemide (LASIX) 20 MG tablet Take 1 tablet (20 mg total) by mouth daily. 30 tablet 3  . lisinopril (PRINIVIL,ZESTRIL) 10 MG tablet Take 1 tablet (10 mg total) by mouth daily. 30 tablet  5  . metoprolol (LOPRESSOR) 50 MG tablet Take 1 tablet (50 mg total) by mouth daily. 30 tablet 5  . ranitidine (ZANTAC) 150 MG capsule Take 1 capsule (150 mg total) by mouth 2 (two) times daily. 60 capsule 11   No current facility-administered medications on file prior to visit.    No results found for: LDLDIRECT    Review of Systems Review of Systems  Constitutional: Negative for fever, appetite change,  and unexpected weight change.  Eyes: Negative for pain and visual disturbance.  Respiratory: Negative for cough and shortness of breath.   Cardiovascular: Negative for cp or palpitations    Gastrointestinal: Negative for nausea, diarrhea and constipation.  Genitourinary: Negative for urgency and frequency.  Skin: Negative for pallor or rash  MSK pos for occ neck pain with exercise, neg for chest pain or joint changes   Neurological: Negative for weakness, light-headedness, numbness and headaches.  Hematological: Negative for adenopathy. Does not bruise/bleed easily.  Psychiatric/Behavioral: Negative for dysphoric mood. The patient is not nervous/anxious.         Objective:   Physical Exam  Constitutional: She appears well-developed and well-nourished. No distress.  obese and well appearing   HENT:  Head: Normocephalic and atraumatic.  Right Ear: External ear normal.  Left Ear: External ear normal.  Nose: Nose normal.  Mouth/Throat: Oropharynx is clear and moist.  Eyes: Conjunctivae and EOM are normal. Pupils are equal, round, and reactive to light. Right eye exhibits no discharge. Left eye exhibits no discharge. No scleral icterus.  Neck: Normal range of motion. Neck supple. No JVD present. No thyromegaly present.  Cardiovascular: Normal rate, regular rhythm, normal heart sounds and intact distal pulses.  Exam reveals no gallop.   Pulmonary/Chest: Effort normal and breath sounds normal. No respiratory distress. She has no wheezes. She has no rales.  Diffusely distant bs     Abdominal: Soft. Bowel sounds are normal. She exhibits no distension and no mass. There is no tenderness.  Genitourinary:  Breast exam: No mass, nodules, thickening, tenderness, bulging, retraction, inflamation, nipple discharge or skin changes noted.  No axillary or clavicular LA.      Musculoskeletal: She exhibits no edema or tenderness.  Lymphadenopathy:    She has no cervical adenopathy.  Neurological: She is alert. She has normal reflexes. No cranial nerve deficit. She exhibits normal muscle tone. Coordination normal.  Skin: Skin is warm and dry. No rash noted. No  erythema. No pallor.  Psychiatric: She has a normal mood and affect.          Assessment & Plan:   Problem List Items Addressed This Visit      Cardiovascular and Mediastinum   Essential hypertension, benign    bp in fair control at this time  BP Readings from Last 1 Encounters:  02/12/15 108/74   No changes needed Disc lifstyle change with low sodium diet and exercise  Labs reviewed       Relevant Medications   atorvastatin (LIPITOR) tablet     Other   Hyperglycemia    Lab Results  Component Value Date   HGBA1C 6.3 02/08/2015   This is up with wt gain  Enc pt to work on low glycemic diet  Also pursue more wt loss (since she gained back)      Hyperlipidemia - Primary    Disc goals for lipids and reasons to control them Rev labs with pt Rev low sat fat diet in detail Will get back to taking her statin medicine       Relevant Medications   atorvastatin (LIPITOR) tablet   Other Relevant Orders   EKG 12-Lead (Completed)   Routine general medical examination at a health care facility    Reviewed health habits including diet and exercise and skin cancer prevention Reviewed appropriate screening tests for age  Also reviewed health mt list, fam hx and immunization status , as well as social and family history   Labs reviewed Stressed imp of wt loss  She will schedule own mammogram  Enc to  consider quitting smoking       Smoking    Disc in detail risks of smoking and possible outcomes including copd, vascular/ heart disease, cancer , respiratory and sinus infections  Pt voices understanding

## 2015-02-12 NOTE — Patient Instructions (Signed)
Don't forget to schedule your mammogram  Please think about quitting smoking  Cholesterol is high -please start your medicine  Work on weight loss/ better diet  You are doing great with exercise

## 2015-02-13 ENCOUNTER — Telehealth: Payer: Self-pay | Admitting: Family Medicine

## 2015-02-13 NOTE — Telephone Encounter (Signed)
emmi emailed °

## 2015-02-14 DIAGNOSIS — Z0000 Encounter for general adult medical examination without abnormal findings: Secondary | ICD-10-CM | POA: Insufficient documentation

## 2015-02-14 NOTE — Assessment & Plan Note (Signed)
Disc in detail risks of smoking and possible outcomes including copd, vascular/ heart disease, cancer , respiratory and sinus infections  Pt voices understanding  

## 2015-02-14 NOTE — Assessment & Plan Note (Signed)
Disc goals for lipids and reasons to control them Rev labs with pt Rev low sat fat diet in detail Will get back to taking her statin medicine

## 2015-02-14 NOTE — Assessment & Plan Note (Signed)
Reviewed health habits including diet and exercise and skin cancer prevention Reviewed appropriate screening tests for age  Also reviewed health mt list, fam hx and immunization status , as well as social and family history   Labs reviewed Stressed imp of wt loss  She will schedule own mammogram  Enc to consider quitting smoking

## 2015-02-14 NOTE — Assessment & Plan Note (Signed)
Lab Results  Component Value Date   HGBA1C 6.3 02/08/2015   This is up with wt gain  Enc pt to work on low glycemic diet  Also pursue more wt loss (since she gained back)

## 2015-02-14 NOTE — Assessment & Plan Note (Signed)
bp in fair control at this time  BP Readings from Last 1 Encounters:  02/12/15 108/74   No changes needed Disc lifstyle change with low sodium diet and exercise  Labs reviewed

## 2015-03-01 ENCOUNTER — Other Ambulatory Visit: Payer: Self-pay | Admitting: Family Medicine

## 2015-03-01 NOTE — Telephone Encounter (Signed)
Clonazepam refill request.  Last seen 02/12/2015.  Last filled 10/30/2014.  Please advise.

## 2015-03-01 NOTE — Telephone Encounter (Signed)
plz phone in. 

## 2015-03-01 NOTE — Telephone Encounter (Signed)
Rx called in as directed.   

## 2015-03-08 ENCOUNTER — Ambulatory Visit (INDEPENDENT_AMBULATORY_CARE_PROVIDER_SITE_OTHER): Payer: BLUE CROSS/BLUE SHIELD | Admitting: Family Medicine

## 2015-03-08 ENCOUNTER — Encounter: Payer: Self-pay | Admitting: Family Medicine

## 2015-03-08 VITALS — BP 140/88 | HR 80 | Temp 98.4°F | Wt 247.0 lb

## 2015-03-08 DIAGNOSIS — J019 Acute sinusitis, unspecified: Secondary | ICD-10-CM | POA: Diagnosis not present

## 2015-03-08 MED ORDER — HYDROCODONE-HOMATROPINE 5-1.5 MG/5ML PO SYRP: 5.0000 mL | ORAL_SOLUTION | Freq: Three times a day (TID) | ORAL | Status: DC | PRN

## 2015-03-08 MED ORDER — AZITHROMYCIN 250 MG PO TABS: ORAL_TABLET | ORAL | Status: DC

## 2015-03-08 NOTE — Progress Notes (Signed)
Pre visit review using our clinic review tool, if applicable. No additional management support is needed unless otherwise documented below in the visit note. 

## 2015-03-08 NOTE — Patient Instructions (Signed)
I think you have a viral upper respiratory infection with early sinusitis Take zpak as directed  Drink lots of fluids and rest  Continue mucinex  Try the hycodan cough medicine -- at night or when not working or driving Update if not starting to improve in a week or if worsening

## 2015-03-08 NOTE — Progress Notes (Signed)
Subjective:    Patient ID: Rebekah Benton, female    DOB: [birth year], 44 y.o.   MRN: [id number]  HPI Here with uri symptoms   8 days  ST and chest congestion  Then sinuses   Lot of congestion head and chest  Bad cough /persistent  Productive and deep - mucous ? Color  Some facial/head pain   No fever or chills   Patient Active Problem List   Diagnosis Date Noted  . Routine general medical examination at a health care facility 02/14/2015  . Right foot pain 07/13/2014  . Right knee pain 07/13/2014  . Right ankle pain 07/13/2014  . Depression with anxiety 02/26/2014  . Hyperglycemia 02/26/2014  . Essential hypertension, benign 02/26/2014  . Hyperlipidemia 02/26/2014  . Smoking 02/26/2014  . GERD (gastroesophageal reflux disease) 02/26/2014   Past Medical History  Diagnosis Date  . Depression   . Diabetes mellitus without complication   . Hypertension   . Hyperlipidemia   . GERD (gastroesophageal reflux disease)    Past Surgical History  Procedure Laterality Date  . Abdominal hysterectomy  2004  . Cholecystectomy  2009  . Sleeve gastroplasty  08/02/2012   History  Substance Use Topics  . Smoking status: Current Every Day Smoker -- 0.50 packs/day    Types: Cigarettes  . Smokeless tobacco: Never Used  . Alcohol Use: No   Family History  Problem Relation Age of Onset  . Cancer Father     Prostate  . Diabetes Father    Allergies  Allergen Reactions  . Ampicillin Rash  . Penicillins Rash   Current Outpatient Prescriptions on File Prior to Visit  Medication Sig Dispense Refill  . atorvastatin (LIPITOR) 40 MG tablet Take 1 tablet (40 mg total) by mouth daily. 30 tablet 11  . clonazePAM (KLONOPIN) 1 MG tablet TAKE ONE TABLET BY MOUTH TWICE DAILY AS NEEDED FOR ANXIETY 60 tablet 0  . escitalopram (LEXAPRO) 20 MG tablet Take 1 tablet (20 mg total) by mouth daily. 30 tablet 11  . furosemide (LASIX) 20 MG tablet Take 1 tablet (20 mg total) by mouth daily. 30  tablet 3  . lisinopril (PRINIVIL,ZESTRIL) 10 MG tablet Take 1 tablet (10 mg total) by mouth daily. 30 tablet 5  . metoprolol (LOPRESSOR) 50 MG tablet Take 1 tablet (50 mg total) by mouth daily. 30 tablet 5  . ranitidine (ZANTAC) 150 MG capsule Take 1 capsule (150 mg total) by mouth 2 (two) times daily. 60 capsule 11   No current facility-administered medications on file prior to visit.      Review of Systems Review of Systems  Constitutional: Negative for fever, appetite change, and unexpected weight change.  ENT pos for cong and rhinorrhea and sinus pressure Eyes: Negative for pain and visual disturbance.  Respiratory: Negative forwheeze and shortness of breath.   Cardiovascular: Negative for cp or palpitations    Gastrointestinal: Negative for nausea, diarrhea and constipation.  Genitourinary: Negative for urgency and frequency.  Skin: Negative for pallor or rash   Neurological: Negative for weakness, light-headedness, numbness and headaches.  Hematological: Negative for adenopathy. Does not bruise/bleed easily.  Psychiatric/Behavioral: Negative for dysphoric mood. The patient is not nervous/anxious.         Objective:   Physical Exam  Constitutional: She appears well-developed and well-nourished. No distress.  obese and well appearing   HENT:  Head: Normocephalic and atraumatic.  Right Ear: External ear normal.  Left Ear: External ear normal.  Mouth/Throat: Oropharynx is clear  and moist. No oropharyngeal exudate.  Nares are injected and congested  Bilateral maxillary sinus tenderness  Post nasal drip   Eyes: Conjunctivae and EOM are normal. Pupils are equal, round, and reactive to light. Right eye exhibits no discharge. Left eye exhibits no discharge.  Neck: Normal range of motion. Neck supple.  Cardiovascular: Normal rate and regular rhythm.   Pulmonary/Chest: Effort normal and breath sounds normal. No respiratory distress. She has no wheezes. She has no rales.    Lymphadenopathy:    She has no cervical adenopathy.  Neurological: She is alert. No cranial nerve deficit.  Skin: Skin is warm and dry. No rash noted.  Psychiatric: She has a normal mood and affect.          Assessment & Plan:   Problem List Items Addressed This Visit      Respiratory   Acute sinusitis - Primary    Early-after uri Cover with zpak  Hycodan for cough Disc symptomatic care - see instructions on AVS  Update if not starting to improve in a week or if worsening        Relevant Medications   azithromycin (ZITHROMAX) tablet   HYDROCODONE-HOMATROPINE 5-1.5 MG/5ML PO SYRP

## 2015-03-10 NOTE — Assessment & Plan Note (Signed)
Early-after uri Cover with zpak  Hycodan for cough Disc symptomatic care - see instructions on AVS  Update if not starting to improve in a week or if worsening

## 2015-03-15 ENCOUNTER — Other Ambulatory Visit: Payer: Self-pay | Admitting: Family Medicine

## 2015-03-19 NOTE — Op Note (Signed)
PATIENT NAME:  Rebekah FanningSUTTON, Rosemarie M MR#:  [id number] DATE OF BIRTH:  October 04, 1971  DATE OF PROCEDURE:  08/02/2012  PREOPERATIVE DIAGNOSIS: Morbid obesity, multiple comorbids.  POSTOPERATIVE DIAGNOSIS: Morbid obesity, multiple comorbids.  PROCEDURE:  Laparoscopic sleeve gastrectomy with hiatal hernia repair.  SURGEON: Primus BravoJon Rhianon Zabawa, MD  ASSISTANT: Mariella SaaSarah Stout, PA-C  ANESTHESIA: General Endotracheal.  CLINICAL HISTORY: See History and Physical.  COMPLICATIONS: None.   FINDINGS: Moderate hiatal hernia.  DETAILS OF PROCEDURE:  The patient was taken to the operating room and placed on the operating room table in the supine, monitors and supplemental oxygen being delivered. Broad spectrum IV antibiotics were administered. The patient was placed under general anesthesia without incident. The abdomen was prepped and draped in the usual sterile fashion. Access was obtained using 5 mm Optical trocar in the left upper outer quadrant. Pneumoperitoneum was established. Multiple other ports were placed in preparation for sleeve gastrectomy and liver retractor placed. At that point, a moderate hiatal hernia was identified. The Harmonic scalpel was utilized to take down the pars flaccida of the right crura and the right crura was fully mobilized. The esophagus was retracted anteriorly showing the crural junction and the moderate hiatal hernia. The posterior vagus nerve was preserved throughout the entire dissection.  The posterior crural repair was performed using interrupted 0 Surgidac suture. At that point, attention was then placed to the stomach. The greater curvature vascular was mobilized from 5 to 7 cm from the pylorus all the way up to the left crura. The posterior attachments were taken down as well completely mobilizing the fundus and the greater curvature of the stomach so the crow's feet could be fully visualized all the way up. A 34 [place] Bougie was then inserted transorally down into the distal antrum and  several sequential firings starting with an echelon green load used to bisect the antrum between the lesser curvature and the greater curvature and then sequential firings of the blue load through the opposite trocar. Using the technique of Hargroder all the way up to just lateral to the GE junction. Staples were all formed very nicely. No evidence of leak was present. No evidence of bleeding was present. The specimen was retrieved and sent for pathologic evaluation. The entire operation blood loss was less than 250 mL. No complications occurred during the entire procedure. I performed upper endoscopy and a nice curvilinear tube was present without any impingement upon the linea angularis. I was quite pleased with the outcome. I used the suction and sucked out the extra gas. I then regowned and regloved and pulled out the delivery retractor as the specimen had already been retrieved. The wounds were closed with 4-0 Vicryl and Dermabond. The patient tolerated the procedure well, was taken to the recovery room in stable condition, and will be admitted for approximately two days for care.    ADDENDUM: The hiatal hernia was closed after exploring the hiatus and mobilizing the esophagus out of the mediastinum and preserving the posterior vagus nerve and esophagus throughout the entire dissection. Interrupted Surgidac sutures were used to reapproximate the posterior crura. ____________________________ Primus BravoJon Kamaree Wheatley, MD jb:slb D: 08/02/2012 17:36:39 ET T: 08/02/2012 17:48:07 ET JOB#: [id number]  cc: Primus BravoJon Boubacar Lerette, MD, <Dictator> Geoffry ParadiseJON M Kalii Chesmore MD ELECTRONICALLY SIGNED 09/06/2012 15:25

## 2015-04-25 ENCOUNTER — Ambulatory Visit
Admission: RE | Admit: 2015-04-25 | Discharge: 2015-04-25 | Disposition: A | Discharge status: Home / Self Care | Payer: BLUE CROSS/BLUE SHIELD | Source: Ambulatory Visit | Attending: Specialist | Admitting: Specialist

## 2015-04-25 ENCOUNTER — Other Ambulatory Visit: Payer: Self-pay | Admitting: Specialist

## 2015-05-27 ENCOUNTER — Other Ambulatory Visit: Payer: Self-pay | Admitting: Family Medicine

## 2015-06-06 ENCOUNTER — Encounter: Payer: Self-pay | Admitting: Family Medicine

## 2015-06-21 ENCOUNTER — Encounter: Payer: Self-pay | Admitting: Family Medicine

## 2015-07-10 ENCOUNTER — Other Ambulatory Visit: Payer: Self-pay | Admitting: Family Medicine

## 2015-07-10 MED ORDER — CLONAZEPAM 1 MG PO TABS: 1.0000 mg | ORAL_TABLET | Freq: Two times a day (BID) | ORAL | Status: DC | PRN

## 2015-07-10 NOTE — Telephone Encounter (Signed)
Dr. Reece Agar out of the office, last refilled 03/01/15 #60 with 0 refills, please advise

## 2015-07-10 NOTE — Telephone Encounter (Signed)
Px written for call in   

## 2015-07-11 NOTE — Telephone Encounter (Signed)
Rx called in as prescribed 

## 2015-10-18 ENCOUNTER — Other Ambulatory Visit: Payer: Self-pay | Admitting: Family Medicine

## 2015-11-05 ENCOUNTER — Encounter: Payer: Self-pay | Admitting: Family Medicine

## 2015-11-26 ENCOUNTER — Other Ambulatory Visit: Payer: Self-pay | Admitting: Family Medicine

## 2015-11-26 NOTE — Telephone Encounter (Signed)
Rx called in as prescribed 

## 2015-11-26 NOTE — Telephone Encounter (Signed)
Px written for call in   

## 2015-11-26 NOTE — Telephone Encounter (Signed)
Electronic refill request, pt has CPE scheduled on 01/03/16, last refilled on 07/10/15 #60 with 0 refills, please advise

## 2015-12-22 ENCOUNTER — Telehealth: Payer: Self-pay | Admitting: Family Medicine

## 2015-12-22 DIAGNOSIS — E785 Hyperlipidemia, unspecified: Secondary | ICD-10-CM

## 2015-12-22 DIAGNOSIS — I10 Essential (primary) hypertension: Secondary | ICD-10-CM

## 2015-12-22 DIAGNOSIS — R739 Hyperglycemia, unspecified: Secondary | ICD-10-CM

## 2015-12-22 NOTE — Telephone Encounter (Signed)
-----   Message from Baldomero Lamy sent at 12/19/2015  2:04 PM EST ----- Regarding: Cpx labs Wed 1/25, need orders. Thanks! :-) Pt is scheduled for cpx labs. Need orders please. She is requesting the "same labs as   last time". She last had a lipid, tsh,a1c,cmet, and cbcd on 02/08/15.  Thanks Rodney Booze

## 2015-12-25 ENCOUNTER — Encounter: Payer: Self-pay | Admitting: *Deleted

## 2015-12-25 ENCOUNTER — Other Ambulatory Visit (INDEPENDENT_AMBULATORY_CARE_PROVIDER_SITE_OTHER): Payer: 59

## 2015-12-25 DIAGNOSIS — R739 Hyperglycemia, unspecified: Secondary | ICD-10-CM

## 2015-12-25 DIAGNOSIS — E785 Hyperlipidemia, unspecified: Secondary | ICD-10-CM

## 2015-12-25 DIAGNOSIS — I10 Essential (primary) hypertension: Secondary | ICD-10-CM | POA: Diagnosis not present

## 2015-12-25 LAB — CBC WITH DIFFERENTIAL/PLATELET
BASOS ABS: 0 10*3/uL (ref 0.0–0.1)
Basophils Relative: 0.4 % (ref 0.0–3.0)
Eosinophils Absolute: 0.1 10*3/uL (ref 0.0–0.7)
Eosinophils Relative: 1.5 % (ref 0.0–5.0)
HCT: 42.6 % (ref 36.0–46.0)
HEMOGLOBIN: 14.2 g/dL (ref 12.0–15.0)
LYMPHS ABS: 2.8 10*3/uL (ref 0.7–4.0)
LYMPHS PCT: 43 % (ref 12.0–46.0)
MCHC: 33.4 g/dL (ref 30.0–36.0)
MCV: 93.3 fl (ref 78.0–100.0)
MONOS PCT: 5.7 % (ref 3.0–12.0)
Monocytes Absolute: 0.4 10*3/uL (ref 0.1–1.0)
NEUTROS PCT: 49.4 % (ref 43.0–77.0)
Neutro Abs: 3.2 10*3/uL (ref 1.4–7.7)
Platelets: 286 10*3/uL (ref 150.0–400.0)
RBC: 4.56 Mil/uL (ref 3.87–5.11)
RDW: 12.8 % (ref 11.5–15.5)
WBC: 6.5 10*3/uL (ref 4.0–10.5)

## 2015-12-25 LAB — LIPID PANEL
Cholesterol: 261 mg/dL — ABNORMAL HIGH (ref 0–200)
HDL: 49.6 mg/dL (ref >39.00)
LDL Cholesterol: 180 mg/dL — ABNORMAL HIGH (ref 0–99)
NONHDL: 211
Total CHOL/HDL Ratio: 5
Triglycerides: 154 mg/dL — ABNORMAL HIGH (ref 0.0–149.0)
VLDL: 30.8 mg/dL (ref 0.0–40.0)

## 2015-12-25 LAB — COMPREHENSIVE METABOLIC PANEL
ALK PHOS: 94 U/L (ref 39–117)
ALT: 17 U/L (ref 0–35)
AST: 19 U/L (ref 0–37)
Albumin: 4.2 g/dL (ref 3.5–5.2)
BUN: 11 mg/dL (ref 6–23)
CO2: 29 mEq/L (ref 19–32)
Calcium: 9.5 mg/dL (ref 8.4–10.5)
Chloride: 105 mEq/L (ref 96–112)
Creatinine, Ser: 0.98 mg/dL (ref 0.40–1.20)
GFR: 65.47 mL/min (ref >60.00)
GLUCOSE: 113 mg/dL — AB (ref 70–99)
Potassium: 4.6 mEq/L (ref 3.5–5.1)
Sodium: 141 mEq/L (ref 135–145)
TOTAL PROTEIN: 6.8 g/dL (ref 6.0–8.3)
Total Bilirubin: 0.5 mg/dL (ref 0.2–1.2)

## 2015-12-25 LAB — TSH: TSH: 0.95 u[IU]/mL (ref 0.35–4.50)

## 2015-12-25 LAB — HEMOGLOBIN A1C: Hgb A1c MFr Bld: 6.2 % (ref 4.6–6.5)

## 2016-01-03 ENCOUNTER — Encounter: Payer: 59 | Admitting: Family Medicine

## 2016-01-08 ENCOUNTER — Encounter: Payer: Self-pay | Admitting: Family Medicine

## 2016-01-17 ENCOUNTER — Encounter: Payer: Self-pay | Admitting: Family Medicine

## 2016-01-17 ENCOUNTER — Ambulatory Visit (INDEPENDENT_AMBULATORY_CARE_PROVIDER_SITE_OTHER): Payer: 59 | Admitting: Family Medicine

## 2016-01-17 VITALS — BP 110/68 | HR 74 | Temp 98.8°F | Ht 67.0 in | Wt 247.5 lb

## 2016-01-17 DIAGNOSIS — I10 Essential (primary) hypertension: Secondary | ICD-10-CM

## 2016-01-17 DIAGNOSIS — E785 Hyperlipidemia, unspecified: Secondary | ICD-10-CM

## 2016-01-17 DIAGNOSIS — Z0000 Encounter for general adult medical examination without abnormal findings: Secondary | ICD-10-CM | POA: Diagnosis not present

## 2016-01-17 DIAGNOSIS — R739 Hyperglycemia, unspecified: Secondary | ICD-10-CM | POA: Diagnosis not present

## 2016-01-17 DIAGNOSIS — IMO0001 Reserved for inherently not codable concepts without codable children: Secondary | ICD-10-CM

## 2016-01-17 DIAGNOSIS — Z720 Tobacco use: Secondary | ICD-10-CM

## 2016-01-17 DIAGNOSIS — F17200 Nicotine dependence, unspecified, uncomplicated: Secondary | ICD-10-CM

## 2016-01-17 MED ORDER — ATORVASTATIN CALCIUM 80 MG PO TABS: 80.0000 mg | ORAL_TABLET | Freq: Every day | ORAL | Status: DC

## 2016-01-17 NOTE — Progress Notes (Signed)
Subjective:    Patient ID: Rebekah Benton, female    DOB: 1971-03-07, 45 y.o.   MRN: [id number]  HPI Here for health maintenance exam and to review chronic medical problems    Wt is stable with bmi of 38   HIV screening - not high risk/ declines , also had a test after her divorce and it was neg   Mammogram - needs to get one - went to [facility] in the past  Self breast exam - no lumps   Flu shot -declines Will update [place] if she changes her mind   Had a hysterectomy  Td 3/15   Smoking status - is not changed - 1 pack per 3 days  Has thought about quitting- but not quite ready yet  Trying to figure out how break the habit  May consider e cig   Hyperglycemia Lab Results  Component Value Date   HGBA1C 6.2 12/25/2015   Down from 6.3  Her diet is fair - she does not eat sweets  carbs are her weakness  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 Walking for exercise - and also working outdoors when she needs to  bp is stable today  No cp or palpitations or headaches or edema  No side effects to medicines  BP Readings from Last 3 Encounters:  01/17/16 110/68  03/08/15 140/88  02/12/15 108/74     'cholesterol  Lab Results  Component Value Date   CHOL 261* 12/25/2015   CHOL 289* 02/08/2015   Lab Results  Component Value Date   HDL 49.60 12/25/2015   HDL 57.50 02/08/2015   Lab Results  Component Value Date   LDLCALC 180* 12/25/2015   LDLCALC 205* 02/08/2015   Lab Results  Component Value Date   TRIG 154.0* 12/25/2015   TRIG 133.0 02/08/2015   Lab Results  Component Value Date   CHOLHDL 5 12/25/2015   CHOLHDL 5 02/08/2015   No results found for: LDLDIRECT   Improved with lipitor - not at goal  Has naturally high cholesterol - runs in the family  Eating fairly low fat (with gastric sleeve)    Patient Active Problem List   Diagnosis Date Noted  . Acute sinusitis 03/08/2015  . Routine general medical examination at a health care facility  02/14/2015  . Right foot pain 07/13/2014  . Right knee pain 07/13/2014  . Right ankle pain 07/13/2014  . Depression with anxiety 02/26/2014  . Hyperglycemia 02/26/2014  . Essential hypertension, benign 02/26/2014  . Hyperlipidemia 02/26/2014  . Smoking 02/26/2014  . GERD (gastroesophageal reflux disease) 02/26/2014   Past Medical History  Diagnosis Date  . Depression   . Diabetes mellitus without complication (HCC)   . Hypertension   . Hyperlipidemia   . GERD (gastroesophageal reflux disease)    Past Surgical History  Procedure Laterality Date  . Abdominal hysterectomy  2004  . Cholecystectomy  2009  . 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 Smoking status: Current Every Day Smoker -- 0.50 packs/day    Types: Cigarettes  . Smokeless tobacco: Never Used  . Alcohol Use: No   Family History  Problem Relation Age of Onset  . Cancer Father     Prostate  . Diabetes Father    Allergies  Allergen Reactions  . Ampicillin Rash  . Penicillins Rash   Current Outpatient Prescriptions on File Prior to Visit  Medication Sig Dispense Refill  . atorvastatin (LIPITOR) 40  MG tablet Take 1 tablet (40 mg total) by mouth daily. 30 tablet 11  . clonazePAM (KLONOPIN) 1 MG tablet TAKE ONE TABLET BY MOUTH TWICE DAILY AS NEEDED FOR ANXIETY 60 tablet 0  . escitalopram (LEXAPRO) 20 MG tablet TAKE ONE TABLET BY MOUTH ONCE DAILY 90 tablet 3  . furosemide (LASIX) 20 MG tablet Take 1 tablet (20 mg total) by mouth daily. 30 tablet 3  . HYDROcodone-homatropine (HYCODAN) 5-1.5 MG/5ML syrup Take 5 mLs by mouth every 8 (eight) hours as needed for cough. 120 mL 0  . lisinopril (PRINIVIL,ZESTRIL) 10 MG tablet TAKE ONE TABLET BY MOUTH ONCE DAILY 30 tablet 6  . metoprolol (LOPRESSOR) 50 MG tablet TAKE ONE TABLET BY MOUTH ONCE DAILY 30 tablet 6  . ranitidine (ZANTAC) 150 MG capsule Take 1 capsule (150 mg total) by mouth 2 (two) times daily. 60 capsule 11   No current  facility-administered medications on file prior to visit.    Review of Systems Review of Systems  Constitutional: Negative for fever, appetite change, fatigue and unexpected weight change.  Eyes: Negative for pain and visual disturbance.  Respiratory: Negative for cough and shortness of breath.   Cardiovascular: Negative for cp or palpitations    Gastrointestinal: Negative for nausea, diarrhea and constipation.  Genitourinary: Negative for urgency and frequency.  Skin: Negative for pallor or rash   Neurological: Negative for weakness, light-headedness, numbness and headaches.  Hematological: Negative for adenopathy. Does not bruise/bleed easily.  Psychiatric/Behavioral: Negative for dysphoric mood. The patient is not nervous/anxious.         Objective:   Physical Exam  Constitutional: She appears well-developed and well-nourished. No distress.  obese and well appearing   HENT:  Head: Normocephalic and atraumatic.  Right Ear: External ear normal.  Left Ear: External ear normal.  Mouth/Throat: Oropharynx is clear and moist.  Eyes: Conjunctivae and EOM are normal. Pupils are equal, round, and reactive to light. No scleral icterus.  Neck: Normal range of motion. Neck supple. No JVD present. Carotid bruit is not present. No thyromegaly present.  Cardiovascular: Normal rate, regular rhythm, normal heart sounds and intact distal pulses.  Exam reveals no gallop.   Pulmonary/Chest: Effort normal and breath sounds normal. No respiratory distress. She has no wheezes. She exhibits no tenderness.  Abdominal: Soft. Bowel sounds are normal. She exhibits no distension, no abdominal bruit and no mass. There is no tenderness.  Genitourinary: No breast swelling, tenderness, discharge or bleeding.  Breast exam: No mass, nodules, thickening, tenderness, bulging, retraction, inflamation, nipple discharge or skin changes noted.  No axillary or clavicular LA.      Musculoskeletal: Normal range of motion.  She exhibits no edema or tenderness.  Lymphadenopathy:    She has no cervical adenopathy.  Neurological: She is alert. She has normal reflexes. No cranial nerve deficit. She exhibits normal muscle tone. Coordination normal.  Skin: Skin is warm and dry. No rash noted. No erythema. No pallor.  Psychiatric: She has a normal mood and affect.          Assessment & Plan:   Problem List Items Addressed This Visit      Cardiovascular and Mediastinum   Essential hypertension, benign    bp in fair control at this time  BP Readings from Last 1 Encounters:  01/17/16 110/68   No changes needed Disc lifstyle change with low sodium diet and exercise  Labs reviewed       Relevant Medications   atorvastatin (LIPITOR) 80 MG tablet  Other   Hyperglycemia    Lab Results  Component Value Date   HGBA1C 6.2 12/25/2015   Stable with current diet and exercis Enc wt loss and low glycemic diet        Hyperlipidemia    Disc goals for lipids and reasons to control them Rev labs with pt Rev low sat fat diet in detail Not at goal with high LDL-will inc her atorvastatin to 80 mg daily as tolerated  Re check in 6 wk Diet handout given       Relevant Medications   atorvastatin (LIPITOR) 80 MG tablet   Other Relevant Orders   Lipid panel   ALT   AST   Routine general medical examination at a health care facility - Primary    Reviewed health habits including diet and exercise and skin cancer prevention Reviewed appropriate screening tests for age  Also reviewed health mt list, fam hx and immunization status , as well as social and family history   See HPI Labs reviewed Don't forget to schedule your mammogram  Keep working on healthy diet and exercise  Keep thinking about quitting smoking  For cholesterol- increase dose of lipitor to 80 mg daily and schedule fasting lab in about 6 wk (Avoid red meat/ fried foods/ egg yolks/ fatty breakfast meats/ butter, cheese and high fat dairy/ and  shellfish)       Smoking    Disc in detail risks of smoking and possible outcomes including copd, vascular/ heart disease, cancer , respiratory and sinus infections  Pt voices understanding Pt is not yet ready to quit  Disc strategies for when she is ready

## 2016-01-17 NOTE — Patient Instructions (Addendum)
Don't forget to schedule your mammogram  Keep working on healthy diet and exercise  Keep thinking about quitting smoking  For cholesterol- increase dose of lipitor to 80 mg daily and schedule fasting lab in about 6 wk (Avoid red meat/ fried foods/ egg yolks/ fatty breakfast meats/ butter, cheese and high fat dairy/ and shellfish)

## 2016-01-17 NOTE — Progress Notes (Signed)
Pre visit review using our clinic review tool, if applicable. No additional management support is needed unless otherwise documented below in the visit note. 

## 2016-01-19 NOTE — Assessment & Plan Note (Signed)
Disc in detail risks of smoking and possible outcomes including copd, vascular/ heart disease, cancer , respiratory and sinus infections  Pt voices understanding Pt is not yet ready to quit  Disc strategies for when she is ready

## 2016-01-19 NOTE — Assessment & Plan Note (Signed)
Disc goals for lipids and reasons to control them Rev labs with pt Rev low sat fat diet in detail Not at goal with high LDL-will inc her atorvastatin to 80 mg daily as tolerated  Re check in 6 wk Diet handout given

## 2016-01-19 NOTE — Assessment & Plan Note (Signed)
Lab Results  Component Value Date   HGBA1C 6.2 12/25/2015   Stable with current diet and exercis Enc wt loss and low glycemic diet

## 2016-01-19 NOTE — Assessment & Plan Note (Addendum)
bp in fair control at this time  BP Readings from Last 1 Encounters:  01/17/16 110/68   No changes needed Disc lifstyle change with low sodium diet and exercise  Labs reviewed

## 2016-01-19 NOTE — Assessment & Plan Note (Signed)
Reviewed health habits including diet and exercise and skin cancer prevention Reviewed appropriate screening tests for age  Also reviewed health mt list, fam hx and immunization status , as well as social and family history   See HPI Labs reviewed Don't forget to schedule your mammogram  Keep working on healthy diet and exercise  Keep thinking about quitting smoking  For cholesterol- increase dose of lipitor to 80 mg daily and schedule fasting lab in about 6 wk (Avoid red meat/ fried foods/ egg yolks/ fatty breakfast meats/ butter, cheese and high fat dairy/ and shellfish)

## 2016-02-21 ENCOUNTER — Other Ambulatory Visit: Payer: 59

## 2016-03-20 ENCOUNTER — Other Ambulatory Visit: Payer: Self-pay | Admitting: Family Medicine

## 2016-03-30 ENCOUNTER — Other Ambulatory Visit: Payer: Self-pay | Admitting: Family Medicine

## 2016-03-30 ENCOUNTER — Encounter: Payer: Self-pay | Admitting: Family Medicine

## 2016-03-30 MED ORDER — CLONAZEPAM 1 MG PO TABS: 1.0000 mg | ORAL_TABLET | Freq: Two times a day (BID) | ORAL | Status: DC | PRN

## 2016-03-30 NOTE — Telephone Encounter (Signed)
Rx called in as prescribed, f/u appt scheduled with Dr. Milinda Antisower tomorrow 03/31/16 @ 8:30 am

## 2016-03-30 NOTE — Telephone Encounter (Signed)
Px written for call in   She mentioned anxiety is worse- please schedule f/u with me for that (30 min office visit)- thanks

## 2016-03-31 ENCOUNTER — Encounter: Payer: Self-pay | Admitting: Family Medicine

## 2016-03-31 ENCOUNTER — Ambulatory Visit (INDEPENDENT_AMBULATORY_CARE_PROVIDER_SITE_OTHER): Payer: 59 | Admitting: Family Medicine

## 2016-03-31 VITALS — BP 126/78 | HR 116 | Temp 98.3°F | Ht 67.0 in | Wt 249.5 lb

## 2016-03-31 DIAGNOSIS — F418 Other specified anxiety disorders: Secondary | ICD-10-CM

## 2016-03-31 DIAGNOSIS — G479 Sleep disorder, unspecified: Secondary | ICD-10-CM | POA: Insufficient documentation

## 2016-03-31 MED ORDER — ZOLPIDEM TARTRATE 10 MG PO TABS: 10.0000 mg | ORAL_TABLET | Freq: Every evening | ORAL | Status: DC | PRN

## 2016-03-31 NOTE — Assessment & Plan Note (Signed)
This is assoc with anxiety and stressors and a work schedule where she comes home late and is too wound up to sleep Will try ambien 10 mg -take once in bed and read until falling asleep Discussed expectations of tis  medication including time to effectiveness and mechanism of action, also poss of side effects (early and late)- including mental fuzziness, and poss of worse dep or anxiety (even suicidal thoughts), or strange night time behaviors.  Adv not to get up once she has taken it and not to mix with her klonopin   Pt voiced understanding and will stop med and update if this occurs

## 2016-03-31 NOTE — Progress Notes (Signed)
Subjective:    Patient ID: Rebekah Benton, female    DOB: 1971-07-25, 45 y.o.   MRN: [id number]  HPI Here with worsened anxiety symptoms   "through the roof"- 3-4 weeks   A number of stressors going on all at one time Working 2 jobs as well (indefinitely)- she does like her jobs (one is active) 45 year old at home - some issues with her  Is overwhelmed   Support-has one friend that she talks to at work  No family at all  Does not take care of anyone besides her daughter   No safety issues -no abuse  Was once this bad a long time ago (had a panic attack while driving)  Tries to read at night/ after she gets home 10:30 - hard to wind down to get to sleep (this is the biggest problem and keeps her on the edge during the day) Gets to sleep at 1 am and gets up 5:30 am  Works both jobs 4 days per week Gets more sleep on the weekends   Feels like her heart is beating hard/ like out of her chest (no cp) Very nervous Irritable  Handles panic attacks by diverting thoughts- learned this over time   lexapro was working well for a while  Klonopin - avg taking a px about every 4 months (tries to avoid that if she can)  Last time she saw a counselor - was for bariatric surgery last year Does not want to see a counselor  Does not think her daughter would do family counseling   In the past celexa did not work  Has been on other medicines   Patient Active Problem List   Diagnosis Date Noted  . Sleep disorder 03/31/2016  . Acute sinusitis 03/08/2015  . Routine general medical examination at a health care facility 02/14/2015  . Right foot pain 07/13/2014  . Right knee pain 07/13/2014  . Right ankle pain 07/13/2014  . Depression with anxiety 02/26/2014  . Hyperglycemia 02/26/2014  . Essential hypertension, benign 02/26/2014  . Hyperlipidemia 02/26/2014  . Smoking 02/26/2014  . GERD (gastroesophageal reflux disease) 02/26/2014   Past Medical History  Diagnosis Date  .  Depression   . Diabetes mellitus without complication (HCC)   . Hypertension   . Hyperlipidemia   . GERD (gastroesophageal reflux disease)    Past Surgical History  Procedure Laterality Date  . Abdominal hysterectomy  2004  . Cholecystectomy  2009  . 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 Smoking status: Current Every Day Smoker -- 0.50 packs/day    Types: Cigarettes  . Smokeless tobacco: Never Used  . Alcohol Use: No   Family History  Problem Relation Age of Onset  . Cancer Father     Prostate  . Diabetes Father    Allergies  Allergen Reactions  . Ampicillin Rash  . Penicillins Rash   Current Outpatient Prescriptions on File Prior to Visit  Medication Sig Dispense Refill  . atorvastatin (LIPITOR) 80 MG tablet Take 1 tablet (80 mg total) by mouth daily. 30 tablet 11  . clonazePAM (KLONOPIN) 1 MG tablet Take 1 tablet (1 mg total) by mouth 2 (two) times daily as needed. for anxiety 60 tablet 0  . escitalopram (LEXAPRO) 20 MG tablet TAKE ONE TABLET BY MOUTH ONCE DAILY 90 tablet 1  . furosemide (LASIX) 20 MG tablet Take 1 tablet (20 mg total) by mouth daily. 30 tablet 3  .  lisinopril (PRINIVIL,ZESTRIL) 10 MG tablet TAKE ONE TABLET BY MOUTH ONCE DAILY 30 tablet 6  . metoprolol (LOPRESSOR) 50 MG tablet TAKE ONE TABLET BY MOUTH ONCE DAILY 30 tablet 6  . omeprazole (PRILOSEC) 20 MG capsule Take 20 mg by mouth daily.     No current facility-administered medications on file prior to visit.    Review of Systems Review of Systems  Constitutional: Negative for fever, appetite change, fatigue and unexpected weight change.  Eyes: Negative for pain and visual disturbance.  Respiratory: Negative for cough and shortness of breath.   Cardiovascular: Negative for cp or palpitations    Gastrointestinal: Negative for nausea, diarrhea and constipation.  Genitourinary: Negative for urgency and frequency.  Skin: Negative for pallor or rash   Neurological:  Negative for weakness, light-headedness, numbness and headaches.  Hematological: Negative for adenopathy. Does not bruise/bleed easily.  Psychiatric/Behavioral: pos for anxiety and insomnia with panic attacks , neg for SI        Objective:   Physical Exam  Constitutional: She appears well-developed and well-nourished. No distress.  obese and well appearing   HENT:  Head: Normocephalic and atraumatic.  Eyes: Conjunctivae and EOM are normal. Pupils are equal, round, and reactive to light. No scleral icterus.  Neck: Normal range of motion. Neck supple.  Cardiovascular: Normal rate and regular rhythm.   Pulmonary/Chest: Effort normal and breath sounds normal. No respiratory distress. She has no wheezes. She has no rales.  Diffusely distant bs   Musculoskeletal: She exhibits no edema.  Lymphadenopathy:    She has no cervical adenopathy.  Neurological: She is alert. She has normal reflexes. No cranial nerve deficit. She exhibits normal muscle tone. Coordination normal.  No tremor   Skin: Skin is warm and dry. No rash noted. No erythema. No pallor.  Psychiatric: Her speech is normal and behavior is normal. Her mood appears anxious. Her affect is not blunt, not labile and not inappropriate. Thought content is not paranoid. She expresses no homicidal ideation.  Very anxious and tearful throughout interview  Fairly good insight overall Does talk about stressors  Seems fatigued with trouble concentrating  She is attentive.          Assessment & Plan:   Problem List Items Addressed This Visit      Other   Depression with anxiety - Primary    Reviewed stressors/ coping techniques/symptoms/ support sources/ tx options and side effects in detail today More stressors No time to see a counselor Disc opt for help and opt for her daughter as well  She does not want to try lexapro at this time Will see if ambien for sleep helps her feel better during the day  Uses klonopin infrequently    >25 minutes spent in face to face time with patient, >50% spent in counselling or coordination of care  Will update       Sleep disorder    This is assoc with anxiety and stressors and a work schedule where she comes home late and is too wound up to sleep Will try ambien 10 mg -take once in bed and read until falling asleep Discussed expectations of tis  medication including time to effectiveness and mechanism of action, also poss of side effects (early and late)- including mental fuzziness, and poss of worse dep or anxiety (even suicidal thoughts), or strange night time behaviors.  Adv not to get up once she has taken it and not to mix with her klonopin   Pt voiced understanding  and will stop med and update if this occurs

## 2016-03-31 NOTE — Assessment & Plan Note (Signed)
Reviewed stressors/ coping techniques/symptoms/ support sources/ tx options and side effects in detail today More stressors No time to see a counselor Disc opt for help and opt for her daughter as well  She does not want to try lexapro at this time Will see if ambien for sleep helps her feel better during the day  Uses klonopin infrequently  >25 minutes spent in face to face time with patient, >50% spent in counselling or coordination of care  Will update

## 2016-03-31 NOTE — Patient Instructions (Signed)
Minimize caffeine to one serving in am  Take care of yourself If you become interested in counseling or family counseling let me know  There is an Chartered loss adjusterauthor names Rebekah Benton who writes about meditation and mindfulness you may want to check out  Continue lexapro  Try ambien at bedtime (take it when you get in bed)- it works best on an empty stomach  Then read until you fall asleep If any problems or side effects let me know  Do not miss your lexapro Do not take klonopin at night with [place]

## 2016-03-31 NOTE — Progress Notes (Signed)
Pre visit review using our clinic review tool, if applicable. No additional management support is needed unless otherwise documented below in the visit note. 

## 2016-05-20 ENCOUNTER — Encounter: Payer: Self-pay | Admitting: Family Medicine

## 2016-06-01 ENCOUNTER — Encounter: Payer: Self-pay | Admitting: Family Medicine

## 2016-06-01 ENCOUNTER — Ambulatory Visit (INDEPENDENT_AMBULATORY_CARE_PROVIDER_SITE_OTHER): Payer: 59 | Admitting: Family Medicine

## 2016-06-01 VITALS — BP 90/60 | HR 56 | Temp 98.2°F | Ht 67.0 in | Wt 246.5 lb

## 2016-06-01 DIAGNOSIS — M7661 Achilles tendinitis, right leg: Secondary | ICD-10-CM

## 2016-06-01 DIAGNOSIS — M7712 Lateral epicondylitis, left elbow: Secondary | ICD-10-CM | POA: Diagnosis not present

## 2016-06-01 DIAGNOSIS — M7662 Achilles tendinitis, left leg: Secondary | ICD-10-CM

## 2016-06-01 DIAGNOSIS — B351 Tinea unguium: Secondary | ICD-10-CM | POA: Diagnosis not present

## 2016-06-01 MED ORDER — METHYLPREDNISOLONE ACETATE 40 MG/ML IJ SUSP
20.0000 mg | Freq: Once | INTRAMUSCULAR | Status: AC
  Administered 2016-06-01: 20 mg via INTRAMUSCULAR

## 2016-06-01 MED ORDER — METHYLPREDNISOLONE ACETATE 40 MG/ML IJ SUSP: 40.0000 mg | Freq: Once | INTRAMUSCULAR | Status: DC

## 2016-06-01 MED ORDER — CICLOPIROX 8 % EX SOLN: Freq: Every day | CUTANEOUS | Status: DC

## 2016-06-01 MED ORDER — NITROGLYCERIN 0.2 MG/HR TD PT24: MEDICATED_PATCH | TRANSDERMAL | Status: DC

## 2016-06-01 NOTE — Progress Notes (Signed)
Dr. Karleen HampshireSpencer T. Marilin Kofman, MD, CAQ Sports Medicine Primary Care and Sports Medicine [address] [place], [id number] Phone: [phone] Fax: [phone]  06/01/2016  Patient: Rebekah FranklinDawn Marie Spada, MRN: [id number], DOB: 1971-06-01, 45 y.o.  Primary Physician:  Roxy MannsMarne Tower, MD   Chief Complaint  Patient presents with  . Elbow Pain    Left x 1 1/2 years  . Foot Pain    Bilateral Heel Pain x 6 months   Subjective:   Dear Dr. Milinda Antisower,  Thank you for having me see Cherylene Maryln ManuelMarie Neisler in consultation today at [facility] for her problem with chronic left-sided elbow pain that is been ongoing for 18 months.  She also has bilateral heel pain that is been present for 6 months..  As you may recall, she is a 45 y.o. year old female with a history of left-sided lateral elbow pain that is been recalcitrant to everything that she has tried thus far and is quite limiting.  She also has heel pain in the posterior aspect of her heel that has been present now for 6 months with unrelenting relief.  She does work approximately 80 hours a week with her 2 jobs, and at her second job she does repetitive motion tasks with her hands.  1. Left LE: Patient describes a dull ache on the L lateral elbow. There is some translation in the proximal forearm and in the distal upper arm. It is painful to lift with the hand facing down and to lift with the thumb in an upright position. Supination is painful. Patient points to the lateral epicondyle as the point of maximal tenderness near ECRB.  No trauma.   No prior fractures or operative interventions in the effective hand. Prior PT or HEP: none  Denies numbness or tingling. No significant neck or shoulder pain.  sshe has done some stretching, massage, and use a tennis elbow brace.  2. Pleasant patient who presents with a 6 mo history of posterior heel pain.  No occult, abrupt onset. Has been more insidious in character. There is a dull ache  present and worse with activity:  Prior home rehab: none Prior meds: NSAIDS, tylenol Orthosis / Braces: multiple over-the-counter orthotics, heel cups, none of which have really made any significant difference.  She also has diffuse onychomycosis on her toenails.  Patient Active Problem List   Diagnosis Date Noted  . Sleep disorder 03/31/2016  . Acute sinusitis 03/08/2015  . Routine general medical examination at a health care facility 02/14/2015  . Right foot pain 07/13/2014  . Right knee pain 07/13/2014  . Right ankle pain 07/13/2014  . Depression with anxiety 02/26/2014  . Hyperglycemia 02/26/2014  . Essential hypertension, benign 02/26/2014  . Hyperlipidemia 02/26/2014  . Smoking 02/26/2014  . GERD (gastroesophageal reflux disease) 02/26/2014    Past Medical History  Diagnosis Date  . Depression   . Diabetes mellitus without complication (HCC)   . Hypertension   . Hyperlipidemia   . GERD (gastroesophageal reflux disease)     Past Surgical History  Procedure Laterality Date  . Abdominal hysterectomy  2004  . Cholecystectomy  2009  . Sleeve gastroplasty  08/02/2012    Social History   Social History  . Marital Status: Single    Spouse Name: N/A  . Number of Children: N/A  . Years of Education: N/A   Occupational History  . Not on file.   Social History Main Topics  . Smoking status: Current Every  Day Smoker -- 0.50 packs/day    Types: Cigarettes  . Smokeless tobacco: Never Used  . Alcohol Use: No  . Drug Use: No  . Sexual Activity: Not on file   Other Topics Concern  . Not on file   Social History Narrative    Family History  Problem Relation Age of Onset  . Cancer Father     Prostate  . Diabetes Father     Allergies  Allergen Reactions  . Ampicillin Rash  . Penicillins Rash    Medication list reviewed and updated in full in [facility].   GEN: No fevers, chills. Nontoxic. Primarily MSK c/o today. MSK: Detailed in the HPI GI:  tolerating PO intake without difficulty Neuro: No numbness, parasthesias, or tingling associated. Otherwise, the pertinent positives and negatives are listed above and in the HPI, otherwise a full review of systems has been reviewed and is negative unless noted positive.   Objective:   BP 90/60 mmHg  Pulse 56  Temp(Src) 98.2 F (36.8 C) (Oral)  Ht 5\' 7"  (1.702 m)  Wt 246 lb 8 oz (111.812 kg)  BMI 38.60 kg/m2    GEN: WDWN, NAD, Non-toxic, Alert & Oriented x 3 HEENT: Atraumatic, Normocephalic.  Ears and Nose: No external deformity. CV: RRR, no m/g/r  PULM: Normal respiratory rate, no accessory muscle use. No wheezes, crackles or rhonchi  EXTR: No clubbing/cyanosis/edema NEURO: Normal gait.  PSYCH: Normally interactive. Conversant. Not depressed or anxious appearing.  Calm demeanor.   Elbow: L Ecchymosis or edema: neg ROM: full flexion, extension, pronation, supination Shoulder ROM: Full Flexion: 5/5 Extension: 5/5, PAINFUL Supination: 5/5, PAINFUL Pronation: 5/5 Wrist ext: 5/5 Wrist flexion: 5/5 No gross bony abnormality Varus and Valgus stress: stable ECRB tenderness: YES, TTP Medial epicondyle: NT Lateral epicondyle, resisted wrist extension from wrist full pronation and flexion: PAINFUL grip: 5/5  sensation intact Tinel's, Elbow: negative  Foot: B Echymosis: no Edema: no ROM: full LE B Gait: heel toe, non-antalgic MT pain: no Callus pattern: none Lateral Mall: NT Medial Mall: NT Talus: NT Navicular: NT Cuboid: NT Calcaneous: NT Metatarsals: NT 5th MT: NT Phalanges: NT Achilles: PAINFUL TO PALPATE AT INSERTION B, SMALL NODULE Plantar Fascia: NT Fat Pad: NT Peroneals: NT Post Tib: NT Great Toe: Nml motion Ant Drawer: neg ATFL: NT CFL: NT Deltoid: NT Sensation: intact   SKIN: Diffuse toenail fungus B  Radiology:  CLINICAL DATA: Right foot and ankle pain medially  EXAM: RIGHT FOOT COMPLETE - 3+ VIEW  COMPARISON:  None  FINDINGS: Osseous mineralization normal.  Joint spaces preserved.  No fracture, dislocation, or bone destruction.  IMPRESSION: Normal exam.   Electronically Signed  By: Ulyses SouthwardMark Boles M.D.  On: 07/13/2014 16:47  CLINICAL DATA: Right ankle and foot pain  EXAM: RIGHT ANKLE - COMPLETE 3+ VIEW  COMPARISON: None.  FINDINGS: No acute fracture is seen. The ankle joint appears normal. Alignment is normal.  IMPRESSION: Negative.   Electronically Signed  By: Dwyane DeePaul Barry M.D.  On: 07/13/2014 16:46  Assessment and Plan:   Left tennis elbow - Plan: methylPREDNISolone acetate (DEPO-MEDROL) injection 20 mg, DISCONTINUED: methylPREDNISolone acetate (DEPO-MEDROL) injection 40 mg  Achilles tendonitis, bilateral  Onychomycosis of toenail  1. 18 months history of recalcitrant tennis elbow.  Prognosis is fair.  Multiple other conservative modalities are possible, and I would consider these prior to consideration of operative intervention.  Reviewed rehabilitation with the patient.  High level of pain right now.  We will do a lateral epicondylar injection. 80  hour work week and repetitive motion activities of the upper extremity certainly contributes.  Lateral Epicondylitis Injection, L Verbal consent was obtained from the patient. Risks, benefits, and alternatives were discussed. Potential complications including loss of pigment, atrophy, and rare risk of infection were discussed. Prepped with Chloraprep and Ethyl Chloride used for anesthesia. Under sterile conditions, the patient was injected at the point of maximal tenderness at the ECRB tendon with 2 cc of Lidocaine 1% and 1 cc of Depo-Medrol 40 mg. Decreased pain after injection. No complications.  Needle size: 22 gauge 1 1/2 inch   2. Pathophysiology of achilles tendinopathy reviewed.  Additionally, I have given the patient the program emphasizing eccentric overloading detailed in the instructions based on Dr.  Renato Gails work and protocols.  Start nitroglycerin protocol to assist with angiogenesis along with eccentric exercise. Apply 1/4 of a patch to the affected area and change every 24 hours (re: tendinopathy)   3. Classic toenail fungus. Trial of Penlac.   Cc: Dr. Milinda Antis  Follow-up: Return in about 6 weeks (around 07/13/2016).  New Prescriptions   CICLOPIROX (PENLAC) 8 % SOLUTION    Apply topically at bedtime. Apply over nail and surrounding skin. Apply daily over previous coat. After seven (7) days, may remove with alcohol and continue cycle.   NITROGLYCERIN (NITRODUR - DOSED IN MG/24 HR) 0.2 MG/HR PATCH    Apply 1/4 of a patch to the affected area and change every 24 hours (re: tendinopathy)   We will see the patient back in Return in about 6 weeks (around 07/13/2016).. If not noted, then follow-up as needed.   Thank you for having [place] see Rebekah Benton in consultation.  Feel free to contact me with any questions.  Signed,  Elpidio Galea. Enoc Getter, MD  Patient Instructions  Achilles Rehab  Begin with easy walking, heel, toe and backwards  Calf raises on a step First lower and then raise on 1 foot If this is painful lower on 1 foot but do the heel raise on both feet  Begin with 3 sets of 10 repetitions  Increase by 5 repetitions every 3 days  Goal is 3 sets of 30 repetitions  Do with both knee straight and knee at 20 degrees of flexion  If pain persists at 3 sets of 30 - add backpack with 5 lbs Increase by 5 lbs per week to max of 30 lbs      Patient's Medications  New Prescriptions   CICLOPIROX (PENLAC) 8 % SOLUTION    Apply topically at bedtime. Apply over nail and surrounding skin. Apply daily over previous coat. After seven (7) days, may remove with alcohol and continue cycle.   NITROGLYCERIN (NITRODUR - DOSED IN MG/24 HR) 0.2 MG/HR PATCH    Apply 1/4 of a patch to the affected area and change every 24 hours (re: tendinopathy)  Previous Medications   ATORVASTATIN  (LIPITOR) 80 MG TABLET    Take 1 tablet (80 mg total) by mouth daily.   CLONAZEPAM (KLONOPIN) 1 MG TABLET    Take 1 tablet (1 mg total) by mouth 2 (two) times daily as needed. for anxiety   ESCITALOPRAM (LEXAPRO) 20 MG TABLET    TAKE ONE TABLET BY MOUTH ONCE DAILY   FUROSEMIDE (LASIX) 20 MG TABLET    Take 1 tablet (20 mg total) by mouth daily.   LISINOPRIL (PRINIVIL,ZESTRIL) 10 MG TABLET    TAKE ONE TABLET BY MOUTH ONCE DAILY   METOPROLOL (LOPRESSOR) 50 MG TABLET  TAKE ONE TABLET BY MOUTH ONCE DAILY   OMEPRAZOLE (PRILOSEC) 20 MG CAPSULE    Take 20 mg by mouth daily.   ZOLPIDEM (AMBIEN) 10 MG TABLET    Take 1 tablet (10 mg total) by mouth at bedtime as needed for sleep.  Modified Medications   No medications on file  Discontinued Medications   No medications on file

## 2016-06-01 NOTE — Patient Instructions (Signed)
Achilles Rehab  Begin with easy walking, heel, toe and backwards  Calf raises on a step First lower and then raise on 1 foot If this is painful lower on 1 foot but do the heel raise on both feet  Begin with 3 sets of 10 repetitions  Increase by 5 repetitions every 3 days  Goal is 3 sets of 30 repetitions  Do with both knee straight and knee at 20 degrees of flexion  If pain persists at 3 sets of 30 - add backpack with 5 lbs Increase by 5 lbs per week to max of 30 lbs  

## 2016-06-01 NOTE — Progress Notes (Signed)
Pre visit review using our clinic review tool, if applicable. No additional management support is needed unless otherwise documented below in the visit note. 

## 2016-06-16 ENCOUNTER — Encounter: Payer: Self-pay | Admitting: Family Medicine

## 2016-06-16 ENCOUNTER — Telehealth: Payer: Self-pay | Admitting: Family Medicine

## 2016-06-16 MED ORDER — METOPROLOL TARTRATE 50 MG PO TABS
50.0000 mg | ORAL_TABLET | Freq: Every day | ORAL | Status: DC
Start: 2016-06-16 — End: 2017-03-07

## 2016-06-16 NOTE — Telephone Encounter (Signed)
Refill metoprolol

## 2016-07-09 ENCOUNTER — Encounter: Payer: Self-pay | Admitting: Family Medicine

## 2016-07-09 DIAGNOSIS — M79671 Pain in right foot: Secondary | ICD-10-CM

## 2016-07-09 DIAGNOSIS — M79672 Pain in left foot: Principal | ICD-10-CM

## 2016-07-10 DIAGNOSIS — M79671 Pain in right foot: Secondary | ICD-10-CM | POA: Insufficient documentation

## 2016-07-10 DIAGNOSIS — M79672 Pain in left foot: Principal | ICD-10-CM

## 2016-07-13 ENCOUNTER — Ambulatory Visit: Payer: 59 | Admitting: Family Medicine

## 2016-07-13 ENCOUNTER — Other Ambulatory Visit: Payer: 59

## 2016-07-15 ENCOUNTER — Other Ambulatory Visit: Payer: Self-pay | Admitting: Family Medicine

## 2016-07-17 ENCOUNTER — Ambulatory Visit (INDEPENDENT_AMBULATORY_CARE_PROVIDER_SITE_OTHER): Payer: 59 | Admitting: Family Medicine

## 2016-07-17 ENCOUNTER — Encounter: Payer: Self-pay | Admitting: Family Medicine

## 2016-07-17 VITALS — BP 126/84 | HR 71 | Temp 98.3°F | Ht 67.0 in | Wt 247.5 lb

## 2016-07-17 DIAGNOSIS — R202 Paresthesia of skin: Secondary | ICD-10-CM

## 2016-07-17 DIAGNOSIS — F418 Other specified anxiety disorders: Secondary | ICD-10-CM | POA: Diagnosis not present

## 2016-07-17 DIAGNOSIS — Z720 Tobacco use: Secondary | ICD-10-CM

## 2016-07-17 DIAGNOSIS — E669 Obesity, unspecified: Secondary | ICD-10-CM

## 2016-07-17 DIAGNOSIS — E785 Hyperlipidemia, unspecified: Secondary | ICD-10-CM

## 2016-07-17 DIAGNOSIS — I10 Essential (primary) hypertension: Secondary | ICD-10-CM | POA: Diagnosis not present

## 2016-07-17 DIAGNOSIS — F17200 Nicotine dependence, unspecified, uncomplicated: Secondary | ICD-10-CM

## 2016-07-17 DIAGNOSIS — R739 Hyperglycemia, unspecified: Secondary | ICD-10-CM | POA: Diagnosis not present

## 2016-07-17 DIAGNOSIS — IMO0001 Reserved for inherently not codable concepts without codable children: Secondary | ICD-10-CM

## 2016-07-17 LAB — COMPREHENSIVE METABOLIC PANEL
ALK PHOS: 103 U/L (ref 39–117)
ALT: 30 U/L (ref 0–35)
AST: 23 U/L (ref 0–37)
Albumin: 4.3 g/dL (ref 3.5–5.2)
BILIRUBIN TOTAL: 0.5 mg/dL (ref 0.2–1.2)
BUN: 14 mg/dL (ref 6–23)
CALCIUM: 9.8 mg/dL (ref 8.4–10.5)
CO2: 30 mEq/L (ref 19–32)
Chloride: 106 mEq/L (ref 96–112)
Creatinine, Ser: 0.98 mg/dL (ref 0.40–1.20)
GFR: 65.31 mL/min (ref >60.00)
Glucose, Bld: 105 mg/dL — ABNORMAL HIGH (ref 70–99)
Potassium: 4.5 mEq/L (ref 3.5–5.1)
Sodium: 142 mEq/L (ref 135–145)
TOTAL PROTEIN: 6.9 g/dL (ref 6.0–8.3)

## 2016-07-17 LAB — LIPID PANEL
CHOL/HDL RATIO: 5
Cholesterol: 265 mg/dL — ABNORMAL HIGH (ref 0–200)
HDL: 54.3 mg/dL (ref >39.00)
LDL Cholesterol: 179 mg/dL — ABNORMAL HIGH (ref 0–99)
NonHDL: 210.72
Triglycerides: 159 mg/dL — ABNORMAL HIGH (ref 0.0–149.0)
VLDL: 31.8 mg/dL (ref 0.0–40.0)

## 2016-07-17 LAB — HEMOGLOBIN A1C: Hgb A1c MFr Bld: 6.3 % (ref 4.6–6.5)

## 2016-07-17 MED ORDER — LISINOPRIL 10 MG PO TABS: 10.0000 mg | ORAL_TABLET | Freq: Every day | ORAL | 11 refills | Status: DC

## 2016-07-17 NOTE — Assessment & Plan Note (Signed)
This is only noticed when waking up - and mostly in ulnar radiation  Has had elbow problems in the past and does sleep with elbows and wrists flexed  Pos Phalen's test today  Suggest wrist splints (otc) to use at night and update

## 2016-07-17 NOTE — Progress Notes (Signed)
Pre visit review using our clinic review tool, if applicable. No additional management support is needed unless otherwise documented below in the visit note. 

## 2016-07-17 NOTE — Assessment & Plan Note (Signed)
bp in fair control at this time  BP Readings from Last 1 Encounters:  07/17/16 126/84   No changes needed Disc lifstyle change with low sodium diet and exercise  Labs today Enc wt loss and smoking cessation

## 2016-07-17 NOTE — Assessment & Plan Note (Signed)
Overall improved with lexapro and a small decrease in stress Reviewed stressors/ coping techniques/symptoms/ support sources/ tx options and side effects in detail today  Needs more time for self care - aware Disc healthy habits and sleep

## 2016-07-17 NOTE — Assessment & Plan Note (Signed)
Disc goals for lipids and reasons to control them Rev labs with pt (last time) Rev low sat fat diet in detail On atorvastatin 80- tolerating it  Lipid panel today

## 2016-07-17 NOTE — Assessment & Plan Note (Signed)
A1C today  Pt has cut back on sugar Disc imp of low glycemic diet and wt loss to prevent DM2

## 2016-07-17 NOTE — Progress Notes (Signed)
Subjective:    Patient ID: Rebekah Benton, female    DOB: [birth year], 45 y.o.   MRN: [id number]  HPI Here for f/u of chronic medical problems  Wt Readings from Last 3 Encounters:  07/17/16 247 lb 8 oz (112.3 kg)  06/01/16 246 lb 8 oz (111.8 kg)  03/31/16 249 lb 8 oz (113.2 kg)  bmi is 38.6  bp is stable today  No cp or palpitations or headaches or edema  No side effects to medicines  BP Readings from Last 3 Encounters:  07/17/16 126/84  06/01/16 90/60  03/31/16 126/78     Smoking status- still smokes- but a little less  Not ready to quit yet  Hopes to later    Mood /anxiety- was here in May and doing better with lexapro  Still hard time sleeping - works 2 jobs -hard to wind down  H[employer] Hotels[employer] ambien sometimes when she has enough time to sleep  Also stress is calming down a bit  She still worries   Hyperlipidemia Lab Results  Component Value Date   CHOL 261 (H) 12/25/2015   CHOL 289 (H) 02/08/2015   Lab Results  Component Value Date   HDL 49.60 12/25/2015   HDL 57.50 02/08/2015   Lab Results  Component Value Date   LDLCALC 180 (H) 12/25/2015   LDLCALC 205 (H) 02/08/2015   Lab Results  Component Value Date   TRIG 154.0 (H) 12/25/2015   TRIG 133.0 02/08/2015   Lab Results  Component Value Date   CHOLHDL 5 12/25/2015   CHOLHDL 5 02/08/2015   No results found for: LDLDIRECT On lipitor 80 mg  Eating pretty normal - avoids sweets/junk food / avoids eating out    Hyperglycemia Lab Results  Component Value Date   HGBA1C 6.2 12/25/2015   Not a sweet eater in general -avoids sugar when she can   Has numbness in fingers when she wakes up and has to shake hands/ feel tight tight and numb  Mostly the 3,4,5th fingers  No pain  Goes away quickly   Patient Active Problem List   Diagnosis Date Noted  . Paresthesia of both hands 07/17/2016  . Foot pain, bilateral 07/10/2016  . Sleep disorder 03/31/2016  . Routine general medical examination at a health care  facility 02/14/2015  . Right foot pain 07/13/2014  . Right knee pain 07/13/2014  . Right ankle pain 07/13/2014  . Depression with anxiety 02/26/2014  . Hyperglycemia 02/26/2014  . Essential hypertension, benign 02/26/2014  . Hyperlipidemia 02/26/2014  . Smoking 02/26/2014  . GERD (gastroesophageal reflux disease) 02/26/2014   Past Medical History:  Diagnosis Date  . Depression   . Diabetes mellitus without complication (HCC)   . GERD (gastroesophageal reflux disease)   . Hyperlipidemia   . Hypertension    Past Surgical History:  Procedure Laterality Date  . ABDOMINAL HYSTERECTOMY  2004  . CHOLECYSTECTOMY  2009  . SLEEVE GASTROPLASTY  08/02/2012   Social History  Substance Use Topics  . Smoking status: Current Every Day Smoker    Packs/day: 0.50    Types: Cigarettes  . Smokeless tobacco: Never Used  . Alcohol use No   Family History  Problem Relation Age of Onset  . Cancer Father     Prostate  . Diabetes Father    Allergies  Allergen Reactions  . Ampicillin Rash  . Penicillins Rash   Current Outpatient Prescriptions on File Prior to Visit  Medication Sig Dispense Refill  . atorvastatin (LIPITOR) 80  MG tablet Take 1 tablet (80 mg total) by mouth daily. 30 tablet 11  . ciclopirox (PENLAC) 8 % solution Apply topically at bedtime. Apply over nail and surrounding skin. Apply daily over previous coat. After seven (7) days, may remove with alcohol and continue cycle. 6.6 mL 5  . clonazePAM (KLONOPIN) 1 MG tablet Take 1 tablet (1 mg total) by mouth 2 (two) times daily as needed. for anxiety 60 tablet 0  . escitalopram (LEXAPRO) 20 MG tablet TAKE ONE TABLET BY MOUTH ONCE DAILY 90 tablet 1  . furosemide (LASIX) 20 MG tablet Take 1 tablet (20 mg total) by mouth daily. 30 tablet 3  . metoprolol (LOPRESSOR) 50 MG tablet Take 1 tablet (50 mg total) by mouth daily. 30 tablet 6  . nitroGLYCERIN (NITRODUR - DOSED IN MG/24 HR) 0.2 mg/hr patch Apply 1/4 of a patch to the affected area  and change every 24 hours (re: tendinopathy) 30 patch 4  . omeprazole (PRILOSEC) 20 MG capsule Take 20 mg by mouth daily.    Marland Kitchen. zolpidem (AMBIEN) 10 MG tablet Take 1 tablet (10 mg total) by mouth at bedtime as needed for sleep. 30 tablet 1   No current facility-administered medications on file prior to visit.     Review of Systems    Review of Systems  Constitutional: Negative for fever, appetite change, fatigue and unexpected weight change.  Eyes: Negative for pain and visual disturbance.  Respiratory: Negative for cough and shortness of breath.   Cardiovascular: Negative for cp or palpitations    Gastrointestinal: Negative for nausea, diarrhea and constipation.  Genitourinary: Negative for urgency and frequency.  Skin: Negative for pallor or rash   Neurological: Negative for weakness, light-headedness, numbness and headaches.  Hematological: Negative for adenopathy. Does not bruise/bleed easily.  Psychiatric/Behavioral: Negative for dysphoric mood. The patient is not nervous/anxious.      Objective:   Physical Exam  Constitutional: She appears well-developed and well-nourished. No distress.  obese and well appearing   HENT:  Head: Normocephalic and atraumatic.  Mouth/Throat: Oropharynx is clear and moist.  Eyes: Conjunctivae and EOM are normal. Pupils are equal, round, and reactive to light.  Neck: Normal range of motion. Neck supple. No JVD present. Carotid bruit is not present. No thyromegaly present.  Cardiovascular: Normal rate, regular rhythm, normal heart sounds and intact distal pulses.  Exam reveals no gallop.   Pulmonary/Chest: Effort normal and breath sounds normal. No respiratory distress. She has no wheezes. She has no rales.  No crackles  Diffusely distant bs  No wheeze   Abdominal: Soft. Bowel sounds are normal. She exhibits no distension, no abdominal bruit and no mass. There is no tenderness.  Musculoskeletal: She exhibits no edema.  Lymphadenopathy:    She has  no cervical adenopathy.  Neurological: She is alert. She has normal reflexes. She displays no atrophy. She exhibits normal muscle tone.  Neg tinel and phalen tests both wrists Nl rom  No tenderness Nl grip   Skin: Skin is warm and dry. No rash noted.  Psychiatric: She has a normal mood and affect.          Assessment & Plan:   Problem List Items Addressed This Visit      Cardiovascular and Mediastinum   Essential hypertension, benign    bp in fair control at this time  BP Readings from Last 1 Encounters:  07/17/16 126/84   No changes needed Disc lifstyle change with low sodium diet and exercise  Labs today  Enc wt loss and smoking cessation       Relevant Medications   lisinopril (PRINIVIL,ZESTRIL) 10 MG tablet     Other   Smoking    Disc in detail risks of smoking and possible outcomes including copd, vascular/ heart disease, cancer , respiratory and sinus infections  Pt voices understanding       Paresthesia of both hands    This is only noticed when waking up - and mostly in ulnar radiation  Has had elbow problems in the past and does sleep with elbows and wrists flexed  Pos Phalen's test today  Suggest wrist splints (otc) to use at night and update      Obesity    Discussed how this problem influences overall health and the risks it imposes  Reviewed plan for weight loss with lower calorie diet (via better food choices and also portion control or program like weight watchers) and exercise building up to or more than 30 minutes 5 days per week including some aerobic activity         Hyperlipidemia    Disc goals for lipids and reasons to control them Rev labs with pt (last time) Rev low sat fat diet in detail On atorvastatin 80- tolerating it  Lipid panel today       Relevant Medications   lisinopril (PRINIVIL,ZESTRIL) 10 MG tablet   Other Relevant Orders   Lipid panel (Completed)   Comprehensive metabolic panel (Completed)   Hyperglycemia    A1C  today  Pt has cut back on sugar Disc imp of low glycemic diet and wt loss to prevent DM2      Relevant Orders   Hemoglobin A1c (Completed)   Depression with anxiety - Primary    Overall improved with lexapro and a small decrease in stress Reviewed stressors/ coping techniques/symptoms/ support sources/ tx options and side effects in detail today  Needs more time for self care - aware Disc healthy habits and sleep        Other Visit Diagnoses   None.

## 2016-07-17 NOTE — Assessment & Plan Note (Signed)
Disc in detail risks of smoking and possible outcomes including copd, vascular/ heart disease, cancer , respiratory and sinus infections  Pt voices understanding  

## 2016-07-17 NOTE — Patient Instructions (Addendum)
I think your night time hand symptoms are positional  Try wearing wrist splints (over the counter) at night  Lab today for cholesterol and sugar Glad mood and stress are improved  Do keep thinking about quitting smoking- cutting back is good   Take care of yourself

## 2016-07-19 DIAGNOSIS — E669 Obesity, unspecified: Secondary | ICD-10-CM | POA: Insufficient documentation

## 2016-07-19 NOTE — Assessment & Plan Note (Signed)
Discussed how this problem influences overall health and the risks it imposes  Reviewed plan for weight loss with lower calorie diet (via better food choices and also portion control or program like weight watchers) and exercise building up to or more than 30 minutes 5 days per week including some aerobic activity    

## 2016-07-20 ENCOUNTER — Other Ambulatory Visit: Payer: Self-pay | Admitting: Family Medicine

## 2016-07-20 ENCOUNTER — Encounter: Payer: Self-pay | Admitting: Family Medicine

## 2016-07-20 DIAGNOSIS — Z1231 Encounter for screening mammogram for malignant neoplasm of breast: Secondary | ICD-10-CM

## 2016-07-20 MED ORDER — ESCITALOPRAM OXALATE 20 MG PO TABS: 20.0000 mg | ORAL_TABLET | Freq: Every day | ORAL | 3 refills | Status: DC

## 2016-07-20 NOTE — Telephone Encounter (Signed)
Refill of lexapro sent 

## 2016-07-30 ENCOUNTER — Ambulatory Visit: Payer: 59 | Admitting: Family Medicine

## 2016-07-31 ENCOUNTER — Ambulatory Visit: Payer: 59 | Attending: Family Medicine

## 2016-09-16 ENCOUNTER — Encounter: Payer: Self-pay | Admitting: Family Medicine

## 2016-09-18 ENCOUNTER — Telehealth: Payer: Self-pay | Admitting: Family Medicine

## 2016-09-18 DIAGNOSIS — N644 Mastodynia: Secondary | ICD-10-CM | POA: Insufficient documentation

## 2016-09-18 NOTE — Telephone Encounter (Signed)
Order for dx mammogram for breast pain - see email from pt  willl route to [facility]

## 2016-09-18 NOTE — Telephone Encounter (Signed)
Please see below.

## 2016-09-21 ENCOUNTER — Other Ambulatory Visit: Payer: Self-pay | Admitting: Family Medicine

## 2016-09-21 DIAGNOSIS — N644 Mastodynia: Secondary | ICD-10-CM

## 2016-09-23 ENCOUNTER — Ambulatory Visit
Admission: RE | Admit: 2016-09-23 | Discharge: 2016-09-23 | Disposition: A | Discharge status: Home / Self Care | Payer: 59 | Source: Ambulatory Visit | Attending: Family Medicine | Admitting: Family Medicine

## 2016-09-23 ENCOUNTER — Other Ambulatory Visit: Payer: Self-pay | Admitting: Family Medicine

## 2016-09-23 DIAGNOSIS — N644 Mastodynia: Secondary | ICD-10-CM | POA: Diagnosis not present

## 2016-10-28 ENCOUNTER — Other Ambulatory Visit: Payer: Self-pay | Admitting: Family Medicine

## 2016-10-28 MED ORDER — CLONAZEPAM 1 MG PO TABS: 1.0000 mg | ORAL_TABLET | Freq: Two times a day (BID) | ORAL | 0 refills | Status: DC | PRN

## 2016-10-28 NOTE — Telephone Encounter (Signed)
Pt had f/u on 07/17/16, last filled on 03/30/16 #60 tabs with 0 refill

## 2016-10-28 NOTE — Telephone Encounter (Signed)
Px written for call in   

## 2016-10-28 NOTE — Telephone Encounter (Signed)
Rx called in as prescribed 

## 2016-10-29 ENCOUNTER — Encounter: Payer: Self-pay | Admitting: Family Medicine

## 2016-10-29 ENCOUNTER — Other Ambulatory Visit (INDEPENDENT_AMBULATORY_CARE_PROVIDER_SITE_OTHER): Payer: 59

## 2016-10-29 ENCOUNTER — Ambulatory Visit (INDEPENDENT_AMBULATORY_CARE_PROVIDER_SITE_OTHER): Payer: 59 | Admitting: Family Medicine

## 2016-10-29 VITALS — BP 130/82 | HR 61 | Temp 98.3°F | Wt 245.5 lb

## 2016-10-29 DIAGNOSIS — E785 Hyperlipidemia, unspecified: Secondary | ICD-10-CM | POA: Diagnosis not present

## 2016-10-29 DIAGNOSIS — R198 Other specified symptoms and signs involving the digestive system and abdomen: Secondary | ICD-10-CM

## 2016-10-29 DIAGNOSIS — K228 Other specified diseases of esophagus: Secondary | ICD-10-CM

## 2016-10-29 LAB — LIPID PANEL
CHOL/HDL RATIO: 3
CHOLESTEROL: 152 mg/dL (ref 0–200)
HDL: 49.5 mg/dL (ref >39.00)
LDL Cholesterol: 86 mg/dL (ref 0–99)
NonHDL: 102.07
TRIGLYCERIDES: 79 mg/dL (ref 0.0–149.0)
VLDL: 15.8 mg/dL (ref 0.0–40.0)

## 2016-10-29 LAB — AST: AST: 19 U/L (ref 0–37)

## 2016-10-29 LAB — ALT: ALT: 22 U/L (ref 0–35)

## 2016-10-29 NOTE — Patient Instructions (Signed)
- 

## 2016-10-29 NOTE — Progress Notes (Signed)
Pre visit review using our clinic review tool, if applicable. No additional management support is needed unless otherwise documented below in the visit note. 

## 2016-10-29 NOTE — Progress Notes (Signed)
   Subjective:    Patient ID: Rebekah Benton, female    DOB: 07-21-1971, 45 y.o.   MRN: [id number]  HPI This is a 45 yo female who presents today with sensation of something being stuck in her throat. Last night she took her Lipitor about 30 minutes before going to bed. Felt like it went down ok but she woke up at 4 am and vomited mucus. She was fasting for labs and didn't have anything in her stomach. She was able to drink some water. She does not have pain, but feels like something is stuck. Has a history of GERD and chronic gastritis. Had EGD last year or year before. Takes Prilosec PRN. Denies fever/chills, cough, wheeze, difficulty swallowing secretions, sensation of swelling of lips/mouth/throat. Had gastric sleeve surgery in 2013.   Past Medical History:  Diagnosis Date  . Depression   . Diabetes mellitus without complication (HCC)   . GERD (gastroesophageal reflux disease)   . Hyperlipidemia   . Hypertension    Past Surgical History:  Procedure Laterality Date  . ABDOMINAL HYSTERECTOMY  2004  . CHOLECYSTECTOMY  2009  . SLEEVE GASTROPLASTY  08/02/2012   Family History  Problem Relation Age of Onset  . Cancer Father     Prostate  . Diabetes Father    Social History  Substance Use Topics  . Smoking status: Current Every Day Smoker    Packs/day: 0.50    Types: Cigarettes  . Smokeless tobacco: Never Used  . Alcohol use No      Review of Systems Per HPI    Objective:   Physical Exam  Constitutional: She is oriented to person, place, and time. She appears well-developed and well-nourished. No distress.  HENT:  Head: Normocephalic and atraumatic.  Mouth/Throat: Oropharynx is clear and moist.  Eyes: Conjunctivae are normal.  Cardiovascular: Normal rate, regular rhythm and normal heart sounds.   Pulmonary/Chest: Effort normal and breath sounds normal. She exhibits no tenderness and no bony tenderness.  Neurological: She is alert and oriented to person, place, and  time.  Skin: Skin is warm and dry. She is not diaphoretic.  Psychiatric: She has a normal mood and affect. Her behavior is normal. Judgment and thought content normal.  Vitals reviewed.     BP 130/82   Pulse 61   Temp 98.3 F (36.8 C) (Oral)   Wt 245 lb 8 oz (111.4 kg)   SpO2 97%   BMI 38.45 kg/m  Wt Readings from Last 3 Encounters:  10/29/16 245 lb 8 oz (111.4 kg)  07/17/16 247 lb 8 oz (112.3 kg)  06/01/16 246 lb 8 oz (111.8 kg)   Patient given warm water and crackers in exam room and was able to consume without difficulty    Assessment & Plan:  1. Sensation of foreign body in esophagus - suspect irritation possibly from atorvastatin - reassured patient that she is able to swallow liquids and solids - she expressed that she did not feel that her symptoms were being taken seriously, I offered her barium swallow and referral to GI, both of which she declined - Asked her to follow up if no improvement in 24 hours   [name], FNP-BC  [facility], [facility]  10/30/2016 8:11 AM

## 2016-11-05 ENCOUNTER — Encounter: Payer: Self-pay | Admitting: Family Medicine

## 2016-11-06 ENCOUNTER — Other Ambulatory Visit: Payer: Self-pay | Admitting: *Deleted

## 2016-11-18 ENCOUNTER — Other Ambulatory Visit: Payer: Self-pay | Admitting: Family Medicine

## 2016-11-27 ENCOUNTER — Other Ambulatory Visit: Payer: Self-pay | Admitting: Family Medicine

## 2016-11-27 NOTE — Telephone Encounter (Signed)
Px written for call in   

## 2016-11-27 NOTE — Telephone Encounter (Signed)
Last OV with Dr. Milinda Antisower was 07/17/16, last filled on 10/28/16 #60 tabs with 0 refill, please advise

## 2016-11-27 NOTE — Telephone Encounter (Signed)
Rx called in as prescribed 

## 2016-12-07 ENCOUNTER — Encounter: Payer: Self-pay | Admitting: Family Medicine

## 2016-12-07 ENCOUNTER — Ambulatory Visit (INDEPENDENT_AMBULATORY_CARE_PROVIDER_SITE_OTHER): Payer: 59 | Admitting: Family Medicine

## 2016-12-07 VITALS — BP 130/86 | HR 91 | Temp 97.6°F | Ht 67.0 in | Wt 246.0 lb

## 2016-12-07 DIAGNOSIS — M7712 Lateral epicondylitis, left elbow: Secondary | ICD-10-CM | POA: Diagnosis not present

## 2016-12-07 MED ORDER — METHYLPREDNISOLONE ACETATE 40 MG/ML IJ SUSP
40.0000 mg | Freq: Once | INTRAMUSCULAR | Status: AC
  Administered 2016-12-07: 40 mg via INTRA_ARTICULAR

## 2016-12-07 NOTE — Progress Notes (Signed)
Pre visit review using our clinic review tool, if applicable. No additional management support is needed unless otherwise documented below in the visit note. 

## 2016-12-07 NOTE — Progress Notes (Signed)
Dr. Karleen HampshireSpencer T. Pedram Goodchild, MD, CAQ [facility] [address] [place], [id number] Phone: [phone] Fax: [phone]  12/07/2016  Patient: Rebekah FranklinDawn Marie Benton, MRN: [id number], DOB: [birth year], 46 y.o.  Primary Physician:  Roxy MannsMarne Tower, MD   Chief Complaint  Patient presents with  . Elbow Pain   Subjective:   F/u LE  Repeat LE inj, L  She had resolution of her symptoms until November or so. She continues to work 80+ hours a week with repetitive motion activities with her hands. Pain at the ECRB. Symptoms now have been ongoing for close to 2 years - not interested in surgery at this point.   06/01/2016 Last OV with Hannah BeatSpencer Jaleah Lefevre, MD  Dear Dr. Milinda Antisower,  Thank you for having me see Rebekah Benton in consultation [facility] for her problem with chronic left-sided elbow pain that is been ongoing for 18 months.  She also has bilateral heel pain that is been present for 6 months..  As you may recall, she is a 46 y.o. year old female with a history of left-sided lateral elbow pain that is been recalcitrant to everything that she has tried thus far and is quite limiting.  She also has heel pain in the posterior aspect of her heel that has been present now for 6 months with unrelenting relief.  She does work approximately 80 hours a week with her 2 jobs, and at her second job she does repetitive motion tasks with her hands.  1. Left LE: Patient describes a dull ache on the L lateral elbow. There is some translation in the proximal forearm and in the distal upper arm. It is painful to lift with the hand facing down and to lift with the thumb in an upright position. Supination is painful. Patient points to the lateral epicondyle as the point of maximal tenderness near ECRB.  No trauma.   No prior fractures or operative interventions in the effective hand. Prior PT or HEP: none  Denies numbness or tingling. No  significant neck or shoulder pain.  sshe has done some stretching, massage, and use a tennis elbow brace.  2. Pleasant patient who presents with a 6 mo history of posterior heel pain.  No occult, abrupt onset. Has been more insidious in character. There is a dull ache present and worse with activity:  Prior home rehab: none Prior meds: NSAIDS, tylenol Orthosis / Braces: multiple over-the-counter orthotics, heel cups, none of which have really made any significant difference.  She also has diffuse onychomycosis on her toenails.  Patient Active Problem List   Diagnosis Date Noted  . Breast pain, left 09/18/2016  . Obesity 07/19/2016  . Paresthesia of both hands 07/17/2016  . Foot pain, bilateral 07/10/2016  . Sleep disorder 03/31/2016  . Routine general medical examination at a health care facility 02/14/2015  . Right foot pain 07/13/2014  . Right knee pain 07/13/2014  . Right ankle pain 07/13/2014  . Depression with anxiety 02/26/2014  . Hyperglycemia 02/26/2014  . Essential hypertension, benign 02/26/2014  . Hyperlipidemia 02/26/2014  . Smoking 02/26/2014  . GERD (gastroesophageal reflux disease) 02/26/2014    Past Medical History:  Diagnosis Date  . Depression   . Diabetes mellitus without complication (HCC)   . GERD (gastroesophageal reflux disease)   . Hyperlipidemia   . Hypertension     Past Surgical History:  Procedure Laterality Date  . ABDOMINAL HYSTERECTOMY  2004  .  CHOLECYSTECTOMY  2009  . SLEEVE GASTROPLASTY  08/02/2012    Social History   Social History  . Marital status: Single    Spouse name: N/A  . Number of children: N/A  . Years of education: N/A   Occupational History  . Not on file.   Social History Main Topics  . Smoking status: Current Every Day Smoker    Packs/day: 0.50    Types: Cigarettes  . Smokeless tobacco: Never Used  . Alcohol use No  . Drug use: No  . Sexual activity: Not on file   Other Topics Concern  . Not on file    Social History Narrative  . No narrative on file    Family History  Problem Relation Age of Onset  . Cancer Father     Prostate  . Diabetes Father     Allergies  Allergen Reactions  . Ampicillin Rash  . Penicillins Rash    Medication list reviewed and updated in full in [facility].   GEN: No fevers, chills. Nontoxic. Primarily MSK c/o today. MSK: Detailed in the HPI GI: tolerating PO intake without difficulty Neuro: No numbness, parasthesias, or tingling associated. Otherwise, the pertinent positives and negatives are listed above and in the HPI, otherwise a full review of systems has been reviewed and is negative unless noted positive.   Objective:   BP 130/86   Pulse 91   Temp 97.6 F (36.4 C) (Oral)   Ht 5\' 7"  (1.702 m)   Wt 246 lb (111.6 kg)   BMI 38.53 kg/m     GEN: WDWN, NAD, Non-toxic, Alert & Oriented x 3 HEENT: Atraumatic, Normocephalic.  Ears and Nose: No external deformity. CV: RRR, no m/g/r  PULM: Normal respiratory rate, no accessory muscle use. No wheezes, crackles or rhonchi  EXTR: No clubbing/cyanosis/edema NEURO: Normal gait.  PSYCH: Normally interactive. Conversant. Not depressed or anxious appearing.  Calm demeanor.   Elbow: L Ecchymosis or edema: neg ROM: full flexion, extension, pronation, supination Shoulder ROM: Full Flexion: 5/5 Extension: 5/5, PAINFUL Supination: 5/5, PAINFUL Pronation: 5/5 Wrist ext: 5/5 Wrist flexion: 5/5 No gross bony abnormality Varus and Valgus stress: stable ECRB tenderness: YES, TTP Medial epicondyle: NT Lateral epicondyle, resisted wrist extension from wrist full pronation and flexion: PAINFUL grip: 5/5  sensation intact Tinel's, Elbow: negative  Radiology:  Assessment and Plan:   Left tennis elbow - Plan: methylPREDNISolone acetate (DEPO-MEDROL) injection 40 mg  Reviewed HEP, eccentrics, massage.  Declines PT referral.  Patient asks about PRP and surgery, and I discussed.  Lateral  Epicondylitis Injection, L Verbal consent was obtained from the patient. Risks, benefits, and alternatives were discussed. Potential complications including loss of pigment, atrophy, and rare risk of infection were discussed. Prepped with Chloraprep and Ethyl Chloride used for anesthesia. Under sterile conditions, the patient was injected at the point of maximal tenderness at the ECRB tendon with 1 cc of Lidocaine 1% and 3/4 cc of Depo-Medrol 40 mg. Decreased pain after injection. No complications.  Needle size: 22 gauge 1 1/2 inch   Follow-up: No Follow-up on file.  Meds ordered this encounter  Medications  . methylPREDNISolone acetate (DEPO-MEDROL) injection 40 mg   Signed,  Mareli Antunes T. Burt Piatek, MD   Allergies as of 12/07/2016      Reactions   Ampicillin Rash   Penicillins Rash      Medication List       Accurate as of 12/07/16 10:08 AM. Always use your most recent med list.  atorvastatin 80 MG tablet Commonly known as:  LIPITOR Take 1 tablet (80 mg total) by mouth daily.   ciclopirox 8 % solution Commonly known as:  PENLAC Apply topically at bedtime. Apply over nail and surrounding skin. Apply daily over previous coat. After seven (7) days, may remove with alcohol and continue cycle.   clonazePAM 1 MG tablet Commonly known as:  KLONOPIN TAKE ONE TABLET BY MOUTH TWICE DAILY AS NEEDED FOR ANXIETY   escitalopram 20 MG tablet Commonly known as:  LEXAPRO Take 1 tablet (20 mg total) by mouth daily.   furosemide 20 MG tablet Commonly known as:  LASIX Take 1 tablet (20 mg total) by mouth daily.   lisinopril 10 MG tablet Commonly known as:  PRINIVIL,ZESTRIL Take 1 tablet (10 mg total) by mouth daily.   metoprolol 50 MG tablet Commonly known as:  LOPRESSOR Take 1 tablet (50 mg total) by mouth daily.   nitroGLYCERIN 0.2 mg/hr patch Commonly known as:  NITRODUR - Dosed in mg/24 hr Apply 1/4 of a patch to the affected area and change every 24 hours (re:  tendinopathy)   omeprazole 20 MG capsule Commonly known as:  PRILOSEC Take 20 mg by mouth daily.   zolpidem 10 MG tablet Commonly known as:  AMBIEN Take 1 tablet (10 mg total) by mouth at bedtime as needed for sleep.

## 2017-03-07 ENCOUNTER — Other Ambulatory Visit: Payer: Self-pay | Admitting: Family Medicine

## 2017-04-07 ENCOUNTER — Other Ambulatory Visit: Payer: Self-pay | Admitting: Family Medicine

## 2017-04-07 MED ORDER — CLONAZEPAM 1 MG PO TABS: 1.0000 mg | ORAL_TABLET | Freq: Two times a day (BID) | ORAL | 0 refills | Status: DC | PRN

## 2017-04-07 NOTE — Telephone Encounter (Signed)
Clonazepam called into [facility] - [place], [place] - [address] Phone: [phone].  

## 2017-04-07 NOTE — Telephone Encounter (Signed)
Last office visit 12/07/2016 with Dr. Patsy Lageropland.  Last refilled 11/27/2016 for #60 with no refills.  Ok to refill?

## 2017-04-07 NOTE — Telephone Encounter (Signed)
Px written for call in   

## 2017-04-23 ENCOUNTER — Encounter: Payer: Self-pay | Admitting: Family Medicine

## 2017-06-11 ENCOUNTER — Other Ambulatory Visit: Payer: Self-pay | Admitting: Family Medicine

## 2017-07-23 ENCOUNTER — Other Ambulatory Visit: Payer: Self-pay | Admitting: Family Medicine

## 2017-07-23 NOTE — Telephone Encounter (Signed)
Pt needs refills today for escitalopram and lisinopril. Pt last seen by Dr Milinda Antis on 07/17/16. Refilled per protocol and pt voiced understanding.

## 2017-08-27 ENCOUNTER — Other Ambulatory Visit: Payer: Self-pay | Admitting: Family Medicine

## 2017-08-27 NOTE — Telephone Encounter (Signed)
Px written for call in   

## 2017-08-27 NOTE — Telephone Encounter (Signed)
Medication phoned to pharmacy.  

## 2017-08-27 NOTE — Telephone Encounter (Signed)
CPE scheduled for 09/15/17, last filled on 04/07/17 #60 tabs with 0 refills, please advise

## 2017-08-30 ENCOUNTER — Encounter: Payer: Self-pay | Admitting: Family Medicine

## 2017-08-30 DIAGNOSIS — J019 Acute sinusitis, unspecified: Secondary | ICD-10-CM

## 2017-08-30 DIAGNOSIS — K2900 Acute gastritis without bleeding: Secondary | ICD-10-CM

## 2017-08-30 DIAGNOSIS — R739 Hyperglycemia, unspecified: Secondary | ICD-10-CM

## 2017-08-30 DIAGNOSIS — K2970 Gastritis, unspecified, without bleeding: Secondary | ICD-10-CM | POA: Insufficient documentation

## 2017-08-30 DIAGNOSIS — E785 Hyperlipidemia, unspecified: Secondary | ICD-10-CM

## 2017-08-30 DIAGNOSIS — I10 Essential (primary) hypertension: Secondary | ICD-10-CM

## 2017-08-30 DIAGNOSIS — K219 Gastro-esophageal reflux disease without esophagitis: Secondary | ICD-10-CM

## 2017-08-30 MED ORDER — SUCRALFATE 1 G PO TABS: 1.0000 g | ORAL_TABLET | Freq: Three times a day (TID) | ORAL | 0 refills | Status: DC

## 2017-09-08 ENCOUNTER — Other Ambulatory Visit (INDEPENDENT_AMBULATORY_CARE_PROVIDER_SITE_OTHER): Payer: 59

## 2017-09-08 DIAGNOSIS — E785 Hyperlipidemia, unspecified: Secondary | ICD-10-CM

## 2017-09-08 DIAGNOSIS — K219 Gastro-esophageal reflux disease without esophagitis: Secondary | ICD-10-CM

## 2017-09-08 DIAGNOSIS — R739 Hyperglycemia, unspecified: Secondary | ICD-10-CM

## 2017-09-08 DIAGNOSIS — K2900 Acute gastritis without bleeding: Secondary | ICD-10-CM | POA: Diagnosis not present

## 2017-09-08 DIAGNOSIS — I10 Essential (primary) hypertension: Secondary | ICD-10-CM | POA: Diagnosis not present

## 2017-09-08 LAB — LIPID PANEL
CHOL/HDL RATIO: 4
Cholesterol: 188 mg/dL (ref 0–200)
HDL: 49.3 mg/dL (ref >39.00)
LDL Cholesterol: 120 mg/dL — ABNORMAL HIGH (ref 0–99)
NONHDL: 138.56
Triglycerides: 92 mg/dL (ref 0.0–149.0)
VLDL: 18.4 mg/dL (ref 0.0–40.0)

## 2017-09-08 LAB — HEMOGLOBIN A1C: Hgb A1c MFr Bld: 6.6 % — ABNORMAL HIGH (ref 4.6–6.5)

## 2017-09-08 LAB — CBC WITH DIFFERENTIAL/PLATELET
Basophils Absolute: 0 10*3/uL (ref 0.0–0.1)
Basophils Relative: 0.4 % (ref 0.0–3.0)
Eosinophils Absolute: 0.1 10*3/uL (ref 0.0–0.7)
Eosinophils Relative: 1.9 % (ref 0.0–5.0)
HCT: 41.5 % (ref 36.0–46.0)
Hemoglobin: 13.7 g/dL (ref 12.0–15.0)
LYMPHS ABS: 3 10*3/uL (ref 0.7–4.0)
Lymphocytes Relative: 42.9 % (ref 12.0–46.0)
MCHC: 33 g/dL (ref 30.0–36.0)
MCV: 95.5 fl (ref 78.0–100.0)
MONO ABS: 0.5 10*3/uL (ref 0.1–1.0)
MONOS PCT: 7.3 % (ref 3.0–12.0)
NEUTROS ABS: 3.3 10*3/uL (ref 1.4–7.7)
NEUTROS PCT: 47.5 % (ref 43.0–77.0)
PLATELETS: 272 10*3/uL (ref 150.0–400.0)
RBC: 4.34 Mil/uL (ref 3.87–5.11)
RDW: 12.8 % (ref 11.5–15.5)
WBC: 6.9 10*3/uL (ref 4.0–10.5)

## 2017-09-08 LAB — COMPREHENSIVE METABOLIC PANEL
ALK PHOS: 115 U/L (ref 39–117)
ALT: 27 U/L (ref 0–35)
AST: 20 U/L (ref 0–37)
Albumin: 4.1 g/dL (ref 3.5–5.2)
BUN: 10 mg/dL (ref 6–23)
CO2: 30 mEq/L (ref 19–32)
CREATININE: 0.95 mg/dL (ref 0.40–1.20)
Calcium: 9.4 mg/dL (ref 8.4–10.5)
Chloride: 105 mEq/L (ref 96–112)
GFR: 67.34 mL/min (ref >60.00)
GLUCOSE: 127 mg/dL — AB (ref 70–99)
Potassium: 3.8 mEq/L (ref 3.5–5.1)
SODIUM: 144 meq/L (ref 135–145)
TOTAL PROTEIN: 6.8 g/dL (ref 6.0–8.3)
Total Bilirubin: 0.5 mg/dL (ref 0.2–1.2)

## 2017-09-08 LAB — TSH: TSH: 1.78 u[IU]/mL (ref 0.35–4.50)

## 2017-09-10 LAB — HELICOBACTER PYLORI ABS-IGG+IGA, BLD: H. pylori, IgG AbS: <0.8 Index Value (ref 0.00–0.79)

## 2017-09-15 ENCOUNTER — Encounter: Payer: Self-pay | Admitting: Family Medicine

## 2017-09-15 ENCOUNTER — Ambulatory Visit (INDEPENDENT_AMBULATORY_CARE_PROVIDER_SITE_OTHER): Payer: 59 | Admitting: Family Medicine

## 2017-09-15 VITALS — BP 128/80 | HR 68 | Temp 98.3°F | Ht 67.0 in | Wt 248.0 lb

## 2017-09-15 DIAGNOSIS — Z0000 Encounter for general adult medical examination without abnormal findings: Secondary | ICD-10-CM

## 2017-09-15 DIAGNOSIS — E66812 Obesity, class 2: Secondary | ICD-10-CM

## 2017-09-15 DIAGNOSIS — Z1231 Encounter for screening mammogram for malignant neoplasm of breast: Secondary | ICD-10-CM | POA: Insufficient documentation

## 2017-09-15 DIAGNOSIS — Z6838 Body mass index (BMI) 38.0-38.9, adult: Secondary | ICD-10-CM

## 2017-09-15 DIAGNOSIS — E785 Hyperlipidemia, unspecified: Secondary | ICD-10-CM

## 2017-09-15 DIAGNOSIS — F17200 Nicotine dependence, unspecified, uncomplicated: Secondary | ICD-10-CM

## 2017-09-15 DIAGNOSIS — IMO0001 Reserved for inherently not codable concepts without codable children: Secondary | ICD-10-CM

## 2017-09-15 DIAGNOSIS — K219 Gastro-esophageal reflux disease without esophagitis: Secondary | ICD-10-CM | POA: Diagnosis not present

## 2017-09-15 DIAGNOSIS — K2900 Acute gastritis without bleeding: Secondary | ICD-10-CM

## 2017-09-15 DIAGNOSIS — F418 Other specified anxiety disorders: Secondary | ICD-10-CM

## 2017-09-15 DIAGNOSIS — R739 Hyperglycemia, unspecified: Secondary | ICD-10-CM | POA: Diagnosis not present

## 2017-09-15 DIAGNOSIS — I10 Essential (primary) hypertension: Secondary | ICD-10-CM

## 2017-09-15 MED ORDER — ESCITALOPRAM OXALATE 10 MG PO TABS: 10.0000 mg | ORAL_TABLET | Freq: Every day | ORAL | 3 refills | Status: DC

## 2017-09-15 MED ORDER — LISINOPRIL 10 MG PO TABS: 10.0000 mg | ORAL_TABLET | Freq: Every day | ORAL | 3 refills | Status: DC

## 2017-09-15 MED ORDER — METOPROLOL TARTRATE 50 MG PO TABS: 50.0000 mg | ORAL_TABLET | Freq: Every day | ORAL | 3 refills | Status: DC

## 2017-09-15 MED ORDER — FUROSEMIDE 20 MG PO TABS: 20.0000 mg | ORAL_TABLET | Freq: Every day | ORAL | 3 refills | Status: DC | PRN

## 2017-09-15 MED ORDER — ATORVASTATIN CALCIUM 80 MG PO TABS: 80.0000 mg | ORAL_TABLET | Freq: Every day | ORAL | 3 refills | Status: DC

## 2017-09-15 NOTE — Patient Instructions (Addendum)
I put a referral in for screening mammogram - call us to schedule it   Start cutting back on coffee  Look at the diet handout   Let's start the referral process to GI   Your sugar is up  Try to get most of your carbohydrates from produce (with the exception of white potatoes)  Eat less bread/pasta/rice/snack foods/cereals/sweets and other items from the middle of the grocery store (processed carbs)  Exercise when you can   Get back on your cholesterol medicine   Schedule a non fasting lab appt in 3 mo for A1C   Decrease lexapro to 10 mg daily   Take care of yourself

## 2017-09-15 NOTE — Progress Notes (Signed)
Subjective:    Patient ID: Rebekah Benton, female    DOB: [birth year], 46 y.o.   MRN: [id number]  HPI Here for health maintenance exam and to review chronic medical problems    Wt Readings from Last 3 Encounters:  09/15/17 248 lb (112.5 kg)  12/07/16 246 lb (111.6 kg)  10/29/16 245 lb 8 oz (111.4 kg)   38.84 kg/m  Declines flu shot   Has had a hysterectomy in 2004 No gyn symptoms    Mammogram 10/17 neg - is due / no breast pain  Will refer to [place]  Self breast exam -no lumps   Tdap 3/15  Smoking status -no change  Smokes about 1/2 ppd  Not ready to quit - occ thinks about it  Biggest barrier is ritual and habit (smokes outside)    bp is stable today  No cp or palpitations or headaches or edema  No side effects to medicines   BP Readings from Last 3 Encounters:  09/15/17 128/80  12/07/16 130/86  10/29/16 130/82     Struggling with gastritis  Had gastric sleeve procedure in the past  Bad heartburn all the time and burning in stomach  Was prev on PPI (omeprazole) -but it was not working and she had to take tums  Baking soda works the best  Tried H2 blocker - ranitidine (also did not help)   occ burning is severe and she gets up to vomit in the night   Done with her surgical follow up   Thinks she had an endoscopy in 2015 - chronic gastritis  Was in [place] - Dr Smitty CordsBruce   Has not tried the carafate yet  Avoid nsaids as a rule   Lots of stress/worse - that is worsening everything   H pylori test is negative  She does watch her diet - worse with sweets and pizza and chocolate  Some caffeine - tea / some coffee    Hyperglycemia  Lab Results  Component Value Date   HGBA1C 6.6 (H) 09/08/2017   This is up from 6.3 Thinks she can get it down  Likes the Mediterranean diet   Hyperlipidemia Lab Results  Component Value Date   CHOL 188 09/08/2017   CHOL 152 10/29/2016   CHOL 265 (H) 07/17/2016   Lab Results  Component Value Date   HDL 49.30  09/08/2017   HDL 49.50 10/29/2016   HDL 54.30 07/17/2016   Lab Results  Component Value Date   LDLCALC 120 (H) 09/08/2017   LDLCALC 86 10/29/2016   LDLCALC 179 (H) 07/17/2016   Lab Results  Component Value Date   TRIG 92.0 09/08/2017   TRIG 79.0 10/29/2016   TRIG 159.0 (H) 07/17/2016   Lab Results  Component Value Date   CHOLHDL 4 09/08/2017   CHOLHDL 3 10/29/2016   CHOLHDL 5 07/17/2016   No results found for: LDLDIRECT Taking lipitor 80 mg - had run out and missed doses  Diet -making an effort  Some exercise (one of her jobs is very physical)  Lab Results  Component Value Date   ALT 27 09/08/2017   AST 20 09/08/2017   ALKPHOS 115 09/08/2017   BILITOT 0.5 09/08/2017   Lab Results  Component Value Date   CREATININE 0.95 09/08/2017   BUN 10 09/08/2017   NA 144 09/08/2017   K 3.8 09/08/2017   CL 105 09/08/2017   CO2 30 09/08/2017    Lab Results  Component Value Date   WBC 6.9 09/08/2017  HGB 13.7 09/08/2017   HCT 41.5 09/08/2017   MCV 95.5 09/08/2017   PLT 272.0 09/08/2017   Lab Results  Component Value Date   TSH 1.78 09/08/2017     Wants to decrease lexapro to 10 mg - thinks she does better on lower dose   Patient Active Problem List   Diagnosis Date Noted  . Encounter for screening mammogram for breast cancer 09/15/2017  . Gastritis 08/30/2017  . Obesity 07/19/2016  . Paresthesia of both hands 07/17/2016  . Foot pain, bilateral 07/10/2016  . Sleep disorder 03/31/2016  . Routine general medical examination at a health care facility 02/14/2015  . Right foot pain 07/13/2014  . Right knee pain 07/13/2014  . Right ankle pain 07/13/2014  . Depression with anxiety 02/26/2014  . Hyperglycemia 02/26/2014  . Essential hypertension, benign 02/26/2014  . Hyperlipidemia 02/26/2014  . Smoking 02/26/2014  . GERD (gastroesophageal reflux disease) 02/26/2014   Past Medical History:  Diagnosis Date  . Depression   . Diabetes mellitus without complication  (HCC)   . GERD (gastroesophageal reflux disease)   . Hyperlipidemia   . Hypertension    Past Surgical History:  Procedure Laterality Date  . ABDOMINAL HYSTERECTOMY  2004  . CHOLECYSTECTOMY  2009  . SLEEVE GASTROPLASTY  08/02/2012   Social History  Substance Use Topics  . Smoking status: Current Every Day Smoker    Packs/day: 0.50    Types: Cigarettes  . Smokeless tobacco: Never Used  . Alcohol use No   Family History  Problem Relation Age of Onset  . Cancer Father        Prostate  . Diabetes Father    Allergies  Allergen Reactions  . Ampicillin Rash  . Penicillins Rash   Current Outpatient Prescriptions on File Prior to Visit  Medication Sig Dispense Refill  . ciclopirox (PENLAC) 8 % solution Apply topically at bedtime. Apply over nail and surrounding skin. Apply daily over previous coat. After seven (7) days, may remove with alcohol and continue cycle. 6.6 mL 5  . clonazePAM (KLONOPIN) 1 MG tablet TAKE 1 TABLET BY MOUTH TWICE DAILY AS NEEDED FOR ANXIETY 60 tablet 0  . nitroGLYCERIN (NITRODUR - DOSED IN MG/24 HR) 0.2 mg/hr patch Apply 1/4 of a patch to the affected area and change every 24 hours (re: tendinopathy) 30 patch 4  . omeprazole (PRILOSEC) 20 MG capsule Take 20 mg by mouth daily.    . sucralfate (CARAFATE) 1 g tablet Take 1 tablet (1 g total) by mouth 4 (four) times daily -  with meals and at bedtime. 90 tablet 0  . zolpidem (AMBIEN) 10 MG tablet Take 1 tablet (10 mg total) by mouth at bedtime as needed for sleep. 30 tablet 1   No current facility-administered medications on file prior to visit.      Review of Systems  Constitutional: Positive for fatigue. Negative for activity change, appetite change, fever and unexpected weight change.  HENT: Negative for congestion, ear pain, rhinorrhea, sinus pressure and sore throat.   Eyes: Negative for pain, redness and visual disturbance.  Respiratory: Negative for cough, shortness of breath and wheezing.     Cardiovascular: Negative for chest pain and palpitations.  Gastrointestinal: Positive for abdominal pain. Negative for anal bleeding, blood in stool, constipation, diarrhea, nausea and vomiting.       Pos for heartburn   Endocrine: Negative for polydipsia and polyuria.  Genitourinary: Negative for dysuria, frequency and urgency.  Musculoskeletal: Negative for arthralgias, back pain and myalgias.  Skin: Negative for pallor and rash.  Allergic/Immunologic: Negative for environmental allergies.  Neurological: Negative for dizziness, syncope and headaches.  Hematological: Negative for adenopathy. Does not bruise/bleed easily.  Psychiatric/Behavioral: Negative for decreased concentration and dysphoric mood. The patient is nervous/anxious.        Objective:   Physical Exam  Constitutional: She appears well-developed and well-nourished. No distress.  obese and well appearing   HENT:  Head: Normocephalic and atraumatic.  Right Ear: External ear normal.  Left Ear: External ear normal.  Mouth/Throat: Oropharynx is clear and moist.  Eyes: Pupils are equal, round, and reactive to light. Conjunctivae and EOM are normal. No scleral icterus.  Neck: Normal range of motion. Neck supple. No JVD present. Carotid bruit is not present. No thyromegaly present.  Cardiovascular: Normal rate, regular rhythm, normal heart sounds and intact distal pulses.  Exam reveals no gallop.   Pulmonary/Chest: Effort normal and breath sounds normal. No respiratory distress. She has no wheezes. She exhibits no tenderness.  Abdominal: Soft. Bowel sounds are normal. She exhibits no distension, no abdominal bruit and no mass. There is no hepatosplenomegaly. There is tenderness in the epigastric area. There is no rebound, no CVA tenderness, no tenderness at McBurney's point and negative Murphy's sign.  Genitourinary: No breast swelling, tenderness, discharge or bleeding.  Genitourinary Comments: Breast exam: No mass, nodules,  thickening, tenderness, bulging, retraction, inflamation, nipple discharge or skin changes noted.  No axillary or clavicular LA.      Musculoskeletal: Normal range of motion. She exhibits no edema or tenderness.  Lymphadenopathy:    She has no cervical adenopathy.  Neurological: She is alert. She has normal reflexes. No cranial nerve deficit. She exhibits normal muscle tone. Coordination normal.  Skin: Skin is warm and dry. No rash noted. No erythema. No pallor.  Solar lentigines diffusely   Psychiatric: She has a normal mood and affect.          Assessment & Plan:   Problem List Items Addressed This Visit      Cardiovascular and Mediastinum   Essential hypertension, benign    bp in fair control at this time  BP Readings from Last 1 Encounters:  09/15/17 128/80   No changes needed Disc lifstyle change with low sodium diet and exercise  Labs reviewed       Relevant Medications   lisinopril (PRINIVIL,ZESTRIL) 10 MG tablet   metoprolol tartrate (LOPRESSOR) 50 MG tablet   furosemide (LASIX) 20 MG tablet   atorvastatin (LIPITOR) 80 MG tablet     Digestive   Gastritis    Epigastric pain and burning with heartburn Worse since gastric sleeve procedure  Enc to start back on ppi/omeprazole Diet disc and handout given  Enc to avoid nsaids  Plans to try carafate (already sent in)  Ref to GI for further eval- she is at risk of pud and barretts        Relevant Orders   Ambulatory referral to Gastroenterology   GERD (gastroesophageal reflux disease)    Complicated by gastric sleeve surgery  Also symptoms of gastritis  Enc to re start omeprazole  Given px for carafate  Ref to GI      Relevant Orders   Ambulatory referral to Gastroenterology     Other   Depression with anxiety    Pt states she does better with 10 mg of lexapro-will reduce dose Reviewed stressors/ coping techniques/symptoms/ support sources/ tx options and side effects in detail today       Relevant  Medications   escitalopram (LEXAPRO) 10 MG tablet   Encounter for screening mammogram for breast cancer    Scheduled annual screening mammogram Nl breast exam today  Encouraged monthly self exams        Relevant Orders   MM DIGITAL SCREENING BILATERAL   Hyperglycemia    Lab Results  Component Value Date   HGBA1C 6.6 (H) 09/08/2017   disc imp of low glycemic diet and wt loss to prevent DM2  Re check 3 mo       Hyperlipidemia    Pt missed doses of her lipitor  Disc goals for lipids and reasons to control them Rev labs with pt Rev low sat fat diet in detail Will need to re check in 3 mo       Relevant Medications   lisinopril (PRINIVIL,ZESTRIL) 10 MG tablet   metoprolol tartrate (LOPRESSOR) 50 MG tablet   furosemide (LASIX) 20 MG tablet   atorvastatin (LIPITOR) 80 MG tablet   Obesity    Discussed how this problem influences overall health and the risks it imposes  Reviewed plan for weight loss with lower calorie diet (via better food choices and also portion control or program like weight watchers) and exercise building up to or more than 30 minutes 5 days per week including some aerobic activity         Routine general medical examination at a health care facility - Primary    Reviewed health habits including diet and exercise and skin cancer prevention Reviewed appropriate screening tests for age  Also reviewed health mt list, fam hx and immunization status , as well as social and family history   Labs reviewed  Ref for mammogram  Ref to GI for GERD  and gastritis        Smoking    Disc in detail risks of smoking and possible outcomes including copd, vascular/ heart disease, cancer , respiratory and sinus infections  Pt voices understanding She is not ready to quit yet

## 2017-09-16 NOTE — Assessment & Plan Note (Signed)
Epigastric pain and burning with heartburn Worse since gastric sleeve procedure  Enc to start back on ppi/omeprazole Diet disc and handout given  Enc to avoid nsaids  Plans to try carafate (already sent in)  Ref to GI for further eval- she is at risk of pud and barretts

## 2017-09-16 NOTE — Assessment & Plan Note (Signed)
Pt states she does better with 10 mg of lexapro-will reduce dose Reviewed stressors/ coping techniques/symptoms/ support sources/ tx options and side effects in detail today

## 2017-09-16 NOTE — Assessment & Plan Note (Signed)
bp in fair control at this time  BP Readings from Last 1 Encounters:  09/15/17 128/80   No changes needed Disc lifstyle change with low sodium diet and exercise  Labs reviewed

## 2017-09-16 NOTE — Assessment & Plan Note (Addendum)
Lab Results  Component Value Date   HGBA1C 6.6 (H) 09/08/2017   disc imp of low glycemic diet and wt loss to prevent DM2  Re check 3 mo

## 2017-09-16 NOTE — Assessment & Plan Note (Signed)
Discussed how this problem influences overall health and the risks it imposes  Reviewed plan for weight loss with lower calorie diet (via better food choices and also portion control or program like weight watchers) and exercise building up to or more than 30 minutes 5 days per week including some aerobic activity    

## 2017-09-16 NOTE — Assessment & Plan Note (Signed)
Scheduled annual screening mammogram Nl breast exam today  Encouraged monthly self exams   

## 2017-09-16 NOTE — Assessment & Plan Note (Signed)
Pt missed doses of her lipitor  Disc goals for lipids and reasons to control them Rev labs with pt Rev low sat fat diet in detail Will need to re check in 3 mo

## 2017-09-16 NOTE — Assessment & Plan Note (Signed)
Complicated by gastric sleeve surgery  Also symptoms of gastritis  Enc to re start omeprazole  Given px for carafate  Ref to GI

## 2017-09-16 NOTE — Assessment & Plan Note (Signed)
Disc in detail risks of smoking and possible outcomes including copd, vascular/ heart disease, cancer , respiratory and sinus infections  Pt voices understanding She is not ready to quit yet  

## 2017-09-16 NOTE — Assessment & Plan Note (Signed)
Reviewed health habits including diet and exercise and skin cancer prevention Reviewed appropriate screening tests for age  Also reviewed health mt list, fam hx and immunization status , as well as social and family history   Labs reviewed  Ref for mammogram  Ref to GI for GERD  and gastritis

## 2017-10-01 ENCOUNTER — Ambulatory Visit (INDEPENDENT_AMBULATORY_CARE_PROVIDER_SITE_OTHER)
Admission: RE | Admit: 2017-10-01 | Discharge: 2017-10-01 | Disposition: A | Discharge status: Home / Self Care | Payer: 59 | Source: Ambulatory Visit | Attending: Family Medicine | Admitting: Family Medicine

## 2017-10-01 ENCOUNTER — Encounter: Payer: Self-pay | Admitting: Family Medicine

## 2017-10-01 ENCOUNTER — Ambulatory Visit (INDEPENDENT_AMBULATORY_CARE_PROVIDER_SITE_OTHER): Payer: 59 | Admitting: Family Medicine

## 2017-10-01 ENCOUNTER — Ambulatory Visit: Payer: Self-pay

## 2017-10-01 VITALS — BP 132/92 | HR 82 | Temp 98.2°F | Ht 67.0 in | Wt 253.0 lb

## 2017-10-01 DIAGNOSIS — M79672 Pain in left foot: Secondary | ICD-10-CM | POA: Diagnosis not present

## 2017-10-01 NOTE — Assessment & Plan Note (Signed)
Several areas of pain/some different  Lateral foot/ achilles insertion and great toe joint  May have some plantar fasciitis-disc stretching/ supportive shoes/ ice (water bottle)  All worsened by prolonged standing  Xray of foot today to r/o stress fracture/bone spurs or arthritis  Has to avoid nsaid for stomach problems unfortunately

## 2017-10-01 NOTE — Progress Notes (Signed)
Subjective:    Patient ID: Rebekah Benton, female    DOB: Jun 22, 1971, 46 y.o.   MRN: [id number]  HPI Here for L foot pain  Going on for months (8-10 months)  Thinks it from standing on cement floor at her 2nd job  After work- hard to get out of the car to walk into the house  Hurts to bear weight/ hurts to bend /hurts to pivot Pain is along the top (lateral)  Also back of the heel "always on fire"  Also her great toe is tender to the touch (that is worse with toe pointing)  Ball of the foot hurts to stand on toes  Everything is bad first thing in the am   Will swell slightly the days she stands  No bruising or redness  No individual joint swelling   She is able to put an ice pack on it during first job If she gets a few days off of the standing job it improves   No trauma  No new shoes -she has tried insoles for more padding  Does not wear heels or flip flops Hurts to walk barefoot   Crocks feel the best of all shoes   Her 2nd job is [employer] - no mats available  Walks and stands   Wt Readings from Last 3 Encounters:  10/01/17 253 lb (114.8 kg)  09/15/17 248 lb (112.5 kg)  12/07/16 246 lb (111.6 kg)   39.63 kg/m   Patient Active Problem List   Diagnosis Date Noted  . Left foot pain 10/01/2017  . Encounter for screening mammogram for breast cancer 09/15/2017  . Gastritis 08/30/2017  . Obesity 07/19/2016  . Paresthesia of both hands 07/17/2016  . Foot pain, bilateral 07/10/2016  . Sleep disorder 03/31/2016  . Routine general medical examination at a health care facility 02/14/2015  . Right foot pain 07/13/2014  . Right knee pain 07/13/2014  . Right ankle pain 07/13/2014  . Depression with anxiety 02/26/2014  . Hyperglycemia 02/26/2014  . Essential hypertension, benign 02/26/2014  . Hyperlipidemia 02/26/2014  . Smoking 02/26/2014  . GERD (gastroesophageal reflux disease) 02/26/2014   Past Medical History:  Diagnosis Date  . Depression   .  Diabetes mellitus without complication (HCC)   . GERD (gastroesophageal reflux disease)   . Hyperlipidemia   . Hypertension    Past Surgical History:  Procedure Laterality Date  . ABDOMINAL HYSTERECTOMY  2004  . CHOLECYSTECTOMY  2009  . SLEEVE GASTROPLASTY  08/02/2012   Social History  Substance Use Topics  . Smoking status: Current Every Day Smoker    Packs/day: 0.50    Types: Cigarettes  . Smokeless tobacco: Never Used  . Alcohol use No   Family History  Problem Relation Age of Onset  . Cancer Father        Prostate  . Diabetes Father    Allergies  Allergen Reactions  . Ampicillin Rash  . Penicillins Rash   Current Outpatient Prescriptions on File Prior to Visit  Medication Sig Dispense Refill  . atorvastatin (LIPITOR) 80 MG tablet Take 1 tablet (80 mg total) by mouth daily. 90 tablet 3  . ciclopirox (PENLAC) 8 % solution Apply topically at bedtime. Apply over nail and surrounding skin. Apply daily over previous coat. After seven (7) days, may remove with alcohol and continue cycle. 6.6 mL 5  . clonazePAM (KLONOPIN) 1 MG tablet TAKE 1 TABLET BY MOUTH TWICE DAILY AS NEEDED FOR ANXIETY 60 tablet 0  .  escitalopram (LEXAPRO) 10 MG tablet Take 1 tablet (10 mg total) by mouth daily. 90 tablet 3  . furosemide (LASIX) 20 MG tablet Take 1 tablet (20 mg total) by mouth daily as needed. 30 tablet 3  . lisinopril (PRINIVIL,ZESTRIL) 10 MG tablet Take 1 tablet (10 mg total) by mouth daily. 90 tablet 3  . metoprolol tartrate (LOPRESSOR) 50 MG tablet Take 1 tablet (50 mg total) by mouth daily. 90 tablet 3  . nitroGLYCERIN (NITRODUR - DOSED IN MG/24 HR) 0.2 mg/hr patch Apply 1/4 of a patch to the affected area and change every 24 hours (re: tendinopathy) 30 patch 4  . omeprazole (PRILOSEC) 20 MG capsule Take 20 mg by mouth daily.    . sucralfate (CARAFATE) 1 g tablet Take 1 tablet (1 g total) by mouth 4 (four) times daily -  with meals and at bedtime. 90 tablet 0  . zolpidem (AMBIEN) 10 MG  tablet Take 1 tablet (10 mg total) by mouth at bedtime as needed for sleep. 30 tablet 1   No current facility-administered medications on file prior to visit.     Review of Systems  Constitutional: Positive for fatigue. Negative for activity change, appetite change, fever and unexpected weight change.  HENT: Negative for congestion, ear pain, rhinorrhea, sinus pressure and sore throat.   Eyes: Negative for pain, redness and visual disturbance.  Respiratory: Negative for cough, shortness of breath and wheezing.   Cardiovascular: Negative for chest pain and palpitations.  Gastrointestinal: Negative for abdominal pain, blood in stool, constipation and diarrhea.       Heartburn and gastritis  Endocrine: Negative for polydipsia and polyuria.  Genitourinary: Negative for dysuria, frequency and urgency.  Musculoskeletal: Negative for arthralgias, back pain and myalgias.       Pos for L foot pain in several areas   Skin: Negative for pallor and rash.  Allergic/Immunologic: Negative for environmental allergies.  Neurological: Negative for dizziness, syncope and headaches.  Hematological: Negative for adenopathy. Does not bruise/bleed easily.  Psychiatric/Behavioral: Negative for decreased concentration and dysphoric mood. The patient is not nervous/anxious.        Objective:   Physical Exam  Constitutional: She appears well-developed and well-nourished. No distress.  obese and well appearing   Eyes: Pupils are equal, round, and reactive to light. Conjunctivae and EOM are normal.  Cardiovascular: Normal rate and regular rhythm.   Musculoskeletal: She exhibits tenderness. She exhibits no edema or deformity.       Left foot: There is tenderness and bony tenderness. There is normal range of motion, no swelling, normal capillary refill, no crepitus, no deformity and no laceration.  Tender over lateral foot - 4,5th MTP (proximal) Tender over great toe diffusely (no swelling or erythema) -no  crepitus  Tender over insertion of achilles  Nl rom of foot with some heel pain on full dorsi flex and some great toe pain with plantar flex  Nl perf and sensation  Gait favors R foot   Neurological: She is alert. No cranial nerve deficit. She exhibits normal muscle tone. Coordination normal.  Skin: Skin is warm and dry. No rash noted. No erythema. No pallor.  Psychiatric: She has a normal mood and affect.          Assessment & Plan:   Problem List Items Addressed This Visit      Other   Left foot pain    Several areas of pain/some different  Lateral foot/ achilles insertion and great toe joint  May have some  plantar fasciitis-disc stretching/ supportive shoes/ ice (water bottle)  All worsened by prolonged standing  Xray of foot today to r/o stress fracture/bone spurs or arthritis  Has to avoid nsaid for stomach problems unfortunately       Relevant Orders   DG Foot Complete Left

## 2017-10-01 NOTE — Patient Instructions (Signed)
Wear the most supportive shoes you have (you may need a stiffer sole)  Use ice whenever you can  Freeze a water bottle to roll your foot over  Don't go barefoot - pt shoes by the bed   Xray today   We will make a plan from there

## 2017-10-01 NOTE — Telephone Encounter (Signed)
-----   Message from Phillips Odorarol S Pullins, RN sent at 10/01/2017  3:43 PM EDT ----- Pt. called for results; given results of Left foot xray; advised of Dr. Royden Purlower's recommendation for seeing a Podiatrist; verb. understanding, and is agreeable.  Prefers to be referred to Dr. Gwyneth RevelsJustin Fowler, DPM, in [place], as she saw him about 10 yrs. ago.  Also is requesting a CD ROM of her foot x-ray.

## 2017-10-03 NOTE — Telephone Encounter (Signed)
Will ref to Dr Rolland PorterFowler   Terri- can you make a CD of her XRAy for her please?

## 2017-10-04 NOTE — Telephone Encounter (Signed)
CD made and ready for pt pick up

## 2017-10-28 ENCOUNTER — Encounter: Payer: Self-pay | Admitting: Family Medicine

## 2017-11-17 ENCOUNTER — Ambulatory Visit
Admission: RE | Admit: 2017-11-17 | Discharge: 2017-11-17 | Disposition: A | Discharge status: Home / Self Care | Payer: 59 | Source: Ambulatory Visit | Attending: Family Medicine | Admitting: Family Medicine

## 2017-11-17 DIAGNOSIS — Z1231 Encounter for screening mammogram for malignant neoplasm of breast: Secondary | ICD-10-CM

## 2017-12-03 IMAGING — MG MM DIGITAL DIAGNOSTIC BILAT W/ TOMO W/ CAD
8 of 14 series · 8 of 30 positions shown · non-contrast
Comparison: Previous exam(s).

ACR Breast Density Category a: The breast tissue is almost entirely
fatty.

CLINICAL DATA: Recent left breast pain that is significantly
decreased. The patient denies any palpable mass or thickening.

EXAM:
2D DIGITAL DIAGNOSTIC BILATERAL MAMMOGRAM WITH CAD AND ADJUNCT TOMO

[L XCCL]
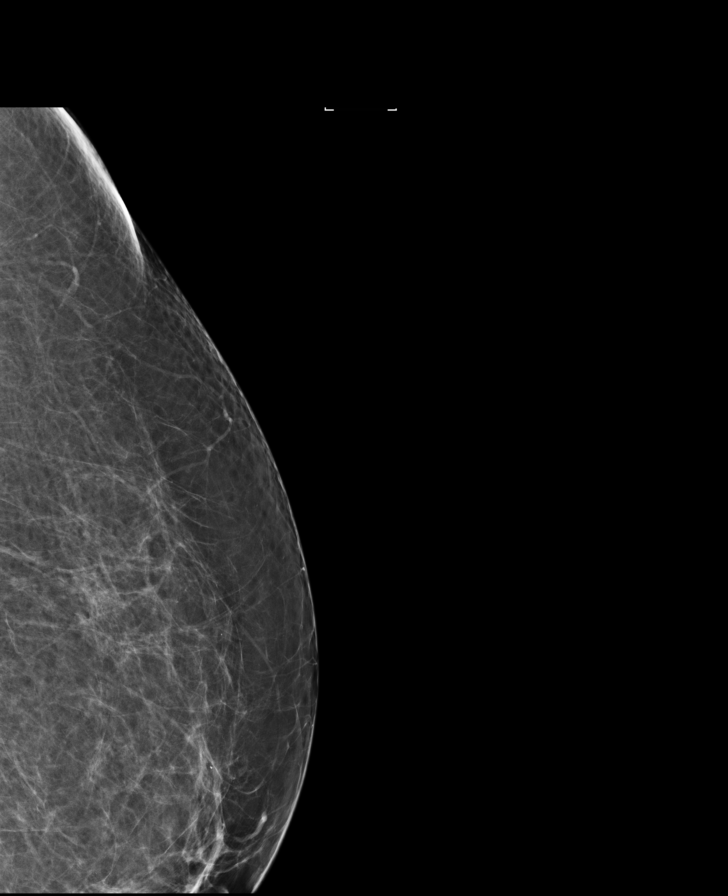

[R MLO]
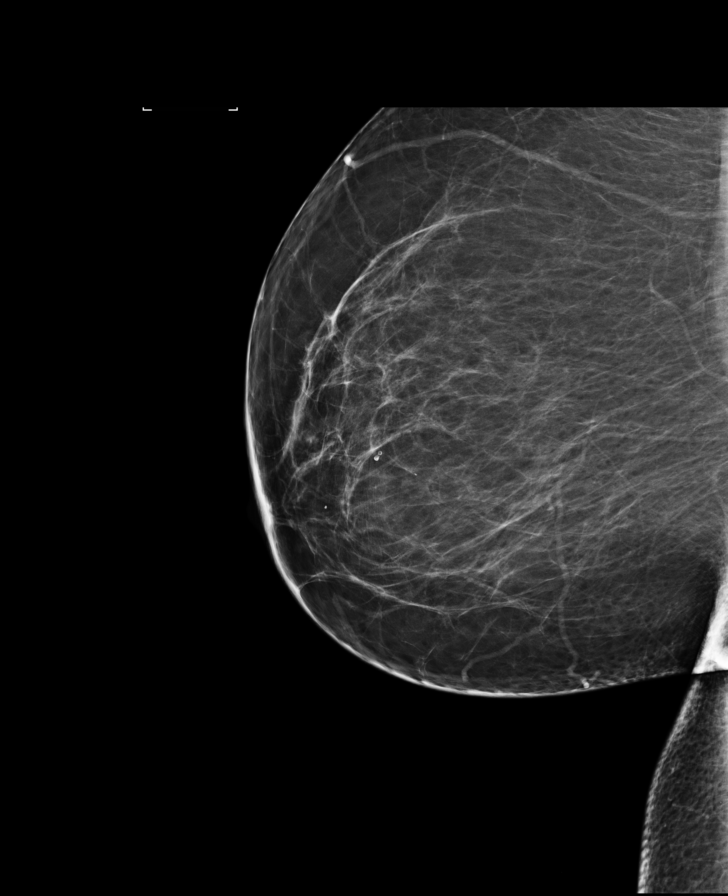

[L CC]
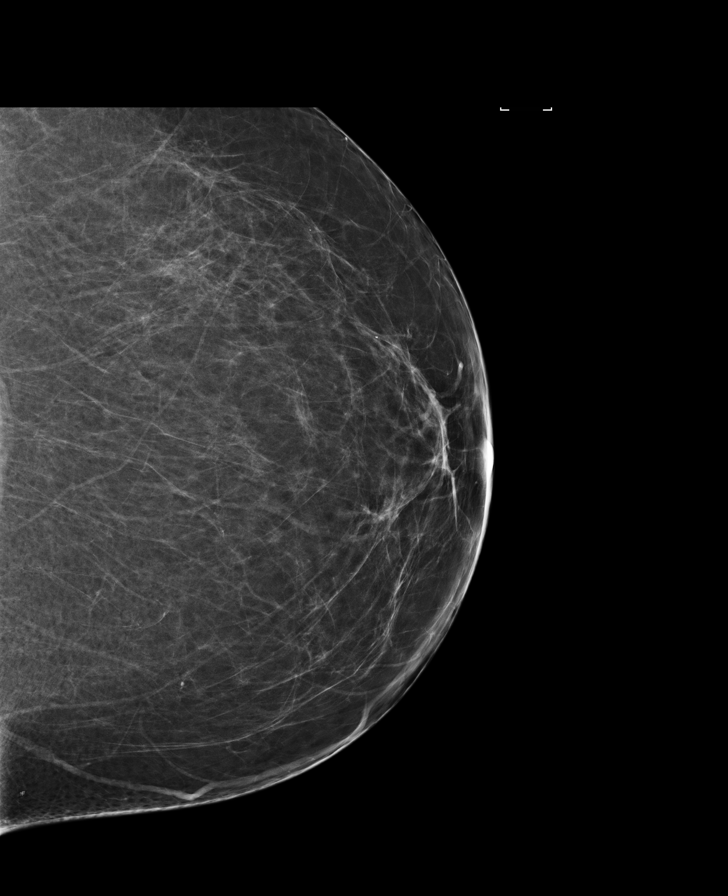

[R MLO synth-2D]
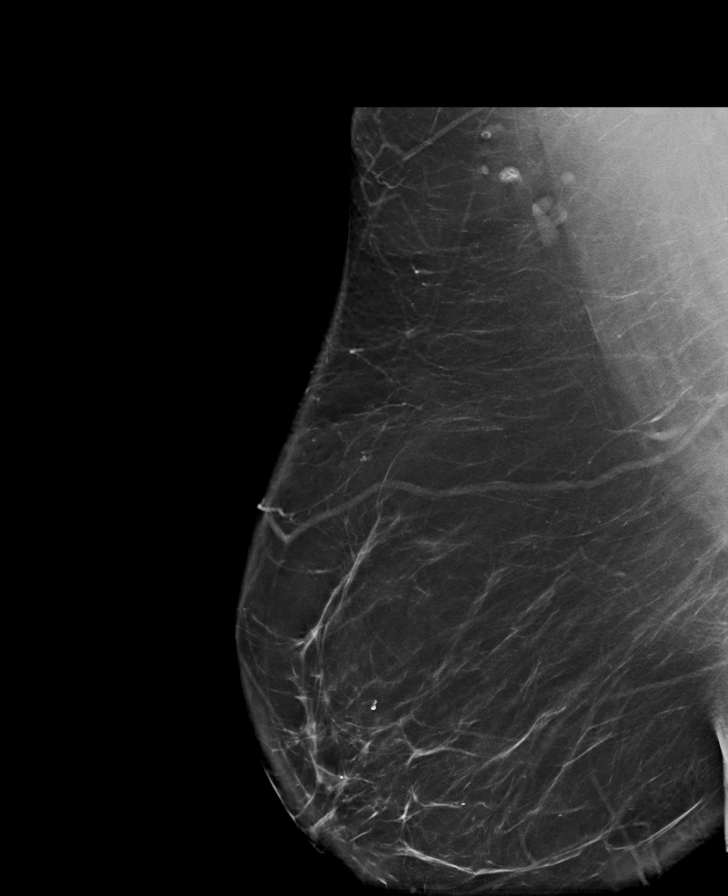

[L CC synth-2D]
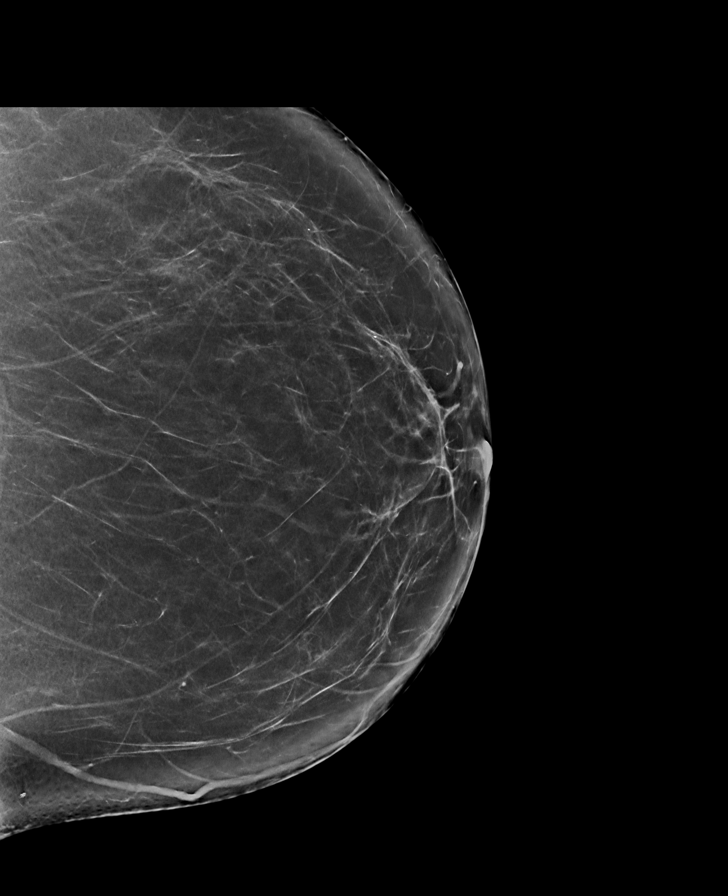

[R CC]
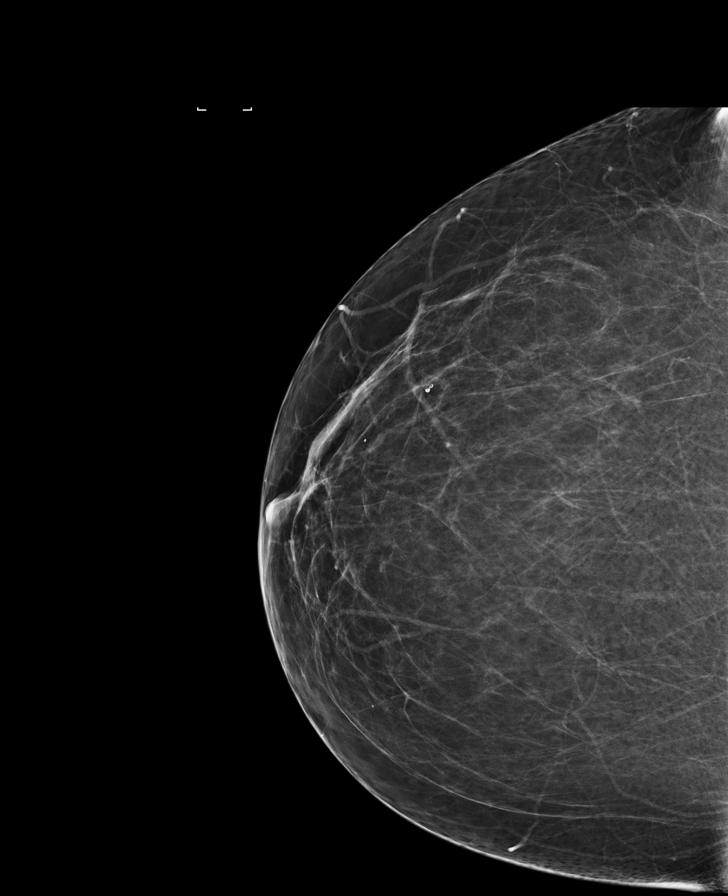

[L MLO]
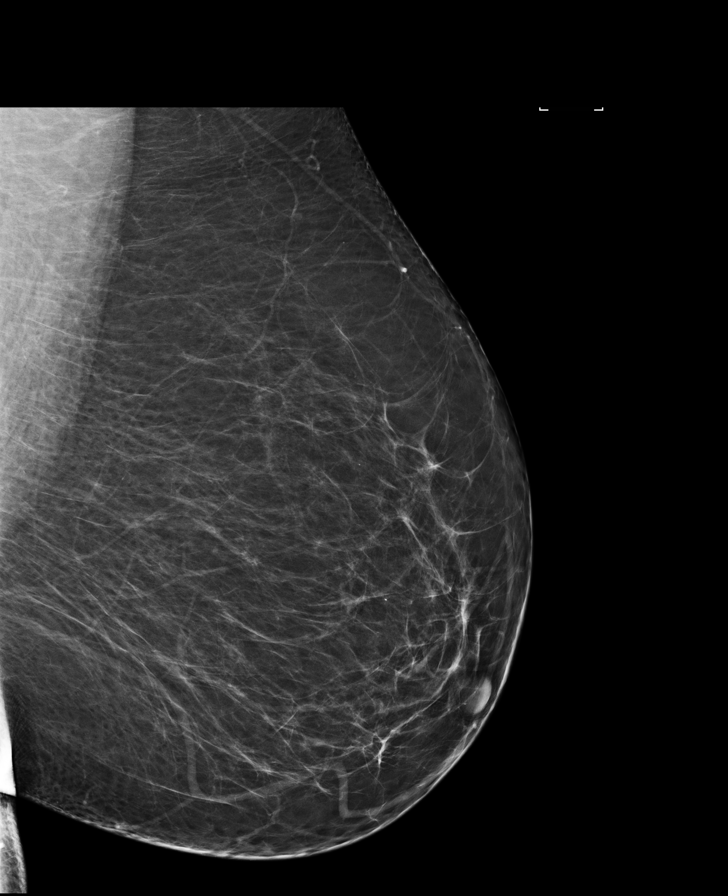

[L MLO synth-2D]
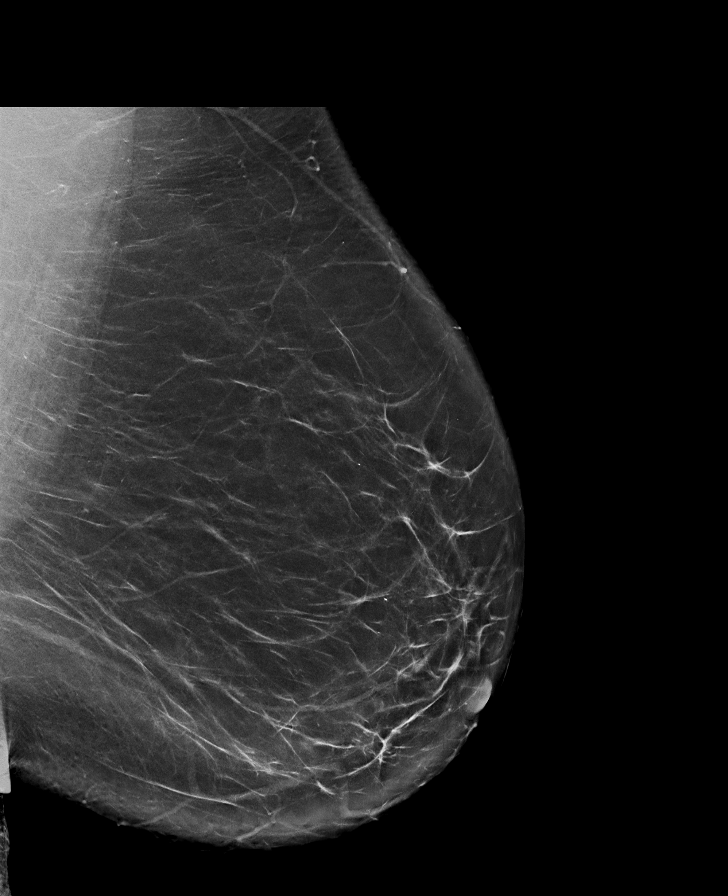

[8 of 30 positions shown; findings below may reference images not displayed]

FINDINGS: No mass, distortion, or suspicious microcalcification is identified
in either breast to suggest malignancy.

Mammographic images were processed with CAD.
IMPRESSION: No evidence of malignancy in either breast.

RECOMMENDATION:
Screening mammogram in one year.(Code:UN-O-KPX)

I have discussed the findings and recommendations with the patient.
Results were also provided in writing at the conclusion of the
visit. If applicable, a reminder letter will be sent to the patient
regarding the next appointment.

BI-RADS CATEGORY  1: Negative.

## 2017-12-06 ENCOUNTER — Other Ambulatory Visit: Payer: Self-pay | Admitting: Family Medicine

## 2017-12-06 NOTE — Telephone Encounter (Signed)
Px written for call in   

## 2017-12-06 NOTE — Telephone Encounter (Signed)
CPE done on 09/15/17, last filled on 08/27/17 #60 tabs with 0 refills, please advise

## 2017-12-06 NOTE — Telephone Encounter (Signed)
Rx called in as prescribed 

## 2017-12-09 ENCOUNTER — Other Ambulatory Visit: Payer: Self-pay

## 2017-12-09 DIAGNOSIS — G4733 Obstructive sleep apnea (adult) (pediatric): Secondary | ICD-10-CM | POA: Insufficient documentation

## 2017-12-14 ENCOUNTER — Ambulatory Visit: Payer: 59 | Admitting: Gastroenterology

## 2018-01-17 ENCOUNTER — Ambulatory Visit: Payer: 59 | Admitting: Psychology

## 2018-01-17 ENCOUNTER — Ambulatory Visit: Payer: 59 | Admitting: Gastroenterology

## 2018-03-17 ENCOUNTER — Other Ambulatory Visit: Payer: Self-pay | Admitting: Family Medicine

## 2018-03-17 NOTE — Telephone Encounter (Signed)
CPE was on 09/15/17 last filled on 12/06/17 #60 tabs with 0 refills

## 2018-04-07 DIAGNOSIS — J069 Acute upper respiratory infection, unspecified: Secondary | ICD-10-CM | POA: Diagnosis not present

## 2018-04-08 ENCOUNTER — Encounter: Payer: Self-pay | Admitting: Family Medicine

## 2018-04-10 MED ORDER — DOXYCYCLINE HYCLATE 100 MG PO CAPS: 100.0000 mg | ORAL_CAPSULE | Freq: Two times a day (BID) | ORAL | 0 refills | Status: DC

## 2018-04-10 NOTE — Telephone Encounter (Signed)
Doxy cap

## 2018-05-16 ENCOUNTER — Encounter: Payer: Self-pay | Admitting: Family Medicine

## 2018-05-16 ENCOUNTER — Ambulatory Visit: Payer: 59 | Admitting: Family Medicine

## 2018-05-16 VITALS — BP 122/76 | HR 84 | Temp 98.9°F | Ht 67.0 in | Wt 260.5 lb

## 2018-05-16 DIAGNOSIS — J029 Acute pharyngitis, unspecified: Secondary | ICD-10-CM

## 2018-05-16 DIAGNOSIS — I10 Essential (primary) hypertension: Secondary | ICD-10-CM | POA: Diagnosis not present

## 2018-05-16 DIAGNOSIS — F17200 Nicotine dependence, unspecified, uncomplicated: Secondary | ICD-10-CM | POA: Diagnosis not present

## 2018-05-16 DIAGNOSIS — M542 Cervicalgia: Secondary | ICD-10-CM | POA: Diagnosis not present

## 2018-05-16 LAB — POCT RAPID STREP A (OFFICE): Rapid Strep A Screen: NEGATIVE

## 2018-05-16 NOTE — Assessment & Plan Note (Signed)
In pt with hx of bariatric surgery  Claims she is sticking to diet well but needs to exercise  Disc strategies to gradually start back with physical activity

## 2018-05-16 NOTE — Assessment & Plan Note (Signed)
Disc in detail risks of smoking and possible outcomes including copd, vascular/ heart disease, cancer , respiratory and sinus infections  Pt voices understanding She is cutting down now  When ready -wants to quit cold [place]

## 2018-05-16 NOTE — Patient Instructions (Addendum)
EKG is re assuring today   We will refer you to ENT for further evaluation with throat   Keep working on quitting smoking - I'm glad you are cutting down   Take care of yourself  Get gradually back into exercise   If the neck discomfort persists please let us know

## 2018-05-16 NOTE — Progress Notes (Signed)
Subjective:    Patient ID: Rebekah Benton, female    DOB: 1971-08-08, 47 y.o.   MRN: [id number]  HPI Here for ST for several weeks (at least)  Tight feeling in her throat - smaller on the inside   Soreness- switches from side to side  Constant ST- worse to swallow A little better now (since the weekend)   No nasal symptoms No facial pain  Clears throat a lot  A little cough -nothing major  She did an e visit - had doxycycline -got a little better and worse again   She worries about throat or esoph cancer    Does not have allergies that she knows of   Takes generic nexium - daily  This helps a lot     Wt Readings from Last 3 Encounters:  05/16/18 260 lb 8 oz (118.2 kg)  10/01/17 253 lb (114.8 kg)  09/15/17 248 lb (112.5 kg)  has gained some weight  Hx of bariatric surgery  Eating fairly well but not exercising-plans to start soon 40.80 kg/m   Thinks her sleep apnea has gotten worse  Thinks it is related to her throat feeling full   Temp: 98.9 F (37.2 C)   Smoking status - less than 1/2 ppd  Has smoked a long time (did quit for 2 years)- overall 20 y or more  Does not have a quit date yet   Strep test is negative today Results for orders placed or performed in visit on 05/16/18  POCT rapid strep A  Result Value Ref Range   Rapid Strep A Screen Negative Negative    bp is stable today  No cp or palpitations or headaches or edema  No side effects to medicines  BP Readings from Last 3 Encounters:  05/16/18 122/76  10/01/17 (!) 132/92  09/15/17 128/80      EKG today  NSR with rate of 69 and no acute changes    Patient Active Problem List   Diagnosis Date Noted  . Sore throat 05/16/2018  . Neck pain 05/16/2018  . Obstructive sleep apnea of adult 12/09/2017  . Left foot pain 10/01/2017  . Encounter for screening mammogram for breast cancer 09/15/2017  . Gastritis 08/30/2017  . Morbid obesity (HCC) 07/19/2016  . Foot pain, bilateral 07/10/2016   . Sleep disorder 03/31/2016  . Routine general medical examination at a health care facility 02/14/2015  . Right knee pain 07/13/2014  . Depression with anxiety 02/26/2014  . Hyperglycemia 02/26/2014  . Essential hypertension, benign 02/26/2014  . Hyperlipidemia 02/26/2014  . Smoking 02/26/2014  . GERD (gastroesophageal reflux disease) 02/26/2014   Past Medical History:  Diagnosis Date  . Depression   . Diabetes mellitus without complication (HCC)   . GERD (gastroesophageal reflux disease)   . Hyperlipidemia   . Hypertension    Past Surgical History:  Procedure Laterality Date  . ABDOMINAL HYSTERECTOMY  2004  . CHOLECYSTECTOMY  2009  . SLEEVE GASTROPLASTY  08/02/2012   Social History   Tobacco Use  . Smoking status: Current Every Day Smoker    Packs/day: 0.25    Types: Cigarettes  . Smokeless tobacco: Never Used  Substance Use Topics  . Alcohol use: No    Alcohol/week: 0.0 oz  . Drug use: No   Family History  Problem Relation Age of Onset  . Cancer Father        Prostate  . Diabetes Father   . Breast cancer Neg Hx    Allergies  Allergen Reactions  . Ampicillin Rash  . Penicillins Rash   Current Outpatient Medications on File Prior to Visit  Medication Sig Dispense Refill  . atorvastatin (LIPITOR) 80 MG tablet Take 1 tablet (80 mg total) by mouth daily. 90 tablet 3  . ciclopirox (PENLAC) 8 % solution Apply topically at bedtime. Apply over nail and surrounding skin. Apply daily over previous coat. After seven (7) days, may remove with alcohol and continue cycle. 6.6 mL 5  . citalopram (CELEXA) 40 MG tablet Take 40 mg by mouth.    . clonazePAM (KLONOPIN) 1 MG tablet TAKE 1 TABLET BY MOUTH TWICE DAILY AS NEEDED FOR ANXIETY 60 tablet 0  . escitalopram (LEXAPRO) 10 MG tablet Take 1 tablet (10 mg total) by mouth daily. 90 tablet 3  . esomeprazole (NEXIUM) 20 MG capsule Take 20 mg by mouth daily at 12 noon.    . furosemide (LASIX) 20 MG tablet Take 1 tablet (20 mg  total) by mouth daily as needed. 30 tablet 3  . HYDROcodone-acetaminophen (LORTAB) 7.5-500 MG/15ML solution Take by mouth.    Marland Kitchen HYDROcodone-homatropine (HYCODAN) 5-1.5 MG/5ML syrup hydrocodone-homatropine 5 mg-1.5 mg/5 mL syrup    . lisinopril (PRINIVIL,ZESTRIL) 10 MG tablet Take 1 tablet (10 mg total) by mouth daily. 90 tablet 3  . metoprolol tartrate (LOPRESSOR) 50 MG tablet Take 1 tablet (50 mg total) by mouth daily. 90 tablet 3  . nitroGLYCERIN (NITRODUR - DOSED IN MG/24 HR) 0.2 mg/hr patch Apply 1/4 of a patch to the affected area and change every 24 hours (re: tendinopathy) 30 patch 4  . sucralfate (CARAFATE) 1 g tablet Take 1 tablet (1 g total) by mouth 4 (four) times daily -  with meals and at bedtime. 90 tablet 0  . zolpidem (AMBIEN) 10 MG tablet Take 1 tablet (10 mg total) by mouth at bedtime as needed for sleep. 30 tablet 1   No current facility-administered medications on file prior to visit.     Review of Systems  Constitutional: Negative for activity change, appetite change, fatigue, fever and unexpected weight change.  HENT: Positive for sore throat. Negative for congestion, ear pain, rhinorrhea, sinus pressure, sinus pain, trouble swallowing and voice change.   Eyes: Negative for pain, redness and visual disturbance.  Respiratory: Negative for cough, shortness of breath, wheezing and stridor.   Cardiovascular: Negative for chest pain, palpitations and leg swelling.  Gastrointestinal: Negative for abdominal distention, abdominal pain, blood in stool, constipation and diarrhea.  Endocrine: Negative for polydipsia and polyuria.  Genitourinary: Negative for dysuria, frequency and urgency.  Musculoskeletal: Positive for neck pain. Negative for arthralgias, back pain and myalgias.  Skin: Negative for pallor and rash.  Allergic/Immunologic: Negative for environmental allergies.  Neurological: Negative for dizziness, syncope, light-headedness, numbness and headaches.  Hematological:  Negative for adenopathy. Does not bruise/bleed easily.  Psychiatric/Behavioral: Negative for decreased concentration and dysphoric mood. The patient is not nervous/anxious.        Objective:   Physical Exam  Constitutional: She appears well-developed and well-nourished. No distress.  obese and well appearing   HENT:  Head: Normocephalic and atraumatic.  Right Ear: External ear normal.  Left Ear: External ear normal.  Nose: Nose normal.  Mouth/Throat: Oropharynx is clear and moist. No oropharyngeal exudate.  No erythema or swelling of throat  No pnd noted   Eyes: Pupils are equal, round, and reactive to light. Conjunctivae and EOM are normal. Right eye exhibits no discharge. Left eye exhibits no discharge. No scleral icterus.  Neck:  Normal range of motion. Neck supple. No JVD present. Carotid bruit is not present. No tracheal deviation present. No thyromegaly present.  Cardiovascular: Normal rate, regular rhythm, normal heart sounds and intact distal pulses. Exam reveals no gallop.  No murmur heard. Pulmonary/Chest: Effort normal and breath sounds normal. No stridor. No respiratory distress. She has no wheezes. She has no rales. She exhibits no tenderness.  No crackles  Abdominal: Soft. Bowel sounds are normal. She exhibits no distension, no abdominal bruit and no mass. There is no tenderness.  Musculoskeletal: She exhibits no edema.  Lymphadenopathy:    She has no cervical adenopathy.  Neurological: She is alert. She has normal reflexes. She displays normal reflexes. No cranial nerve deficit. Coordination normal.  Skin: Skin is warm and dry. Capillary refill takes less than 2 seconds. No rash noted. No erythema. No pallor.  Psychiatric: Her mood appears anxious.  Pleasant  Mildly anxious           Assessment & Plan:   Problem List Items Addressed This Visit      Cardiovascular and Mediastinum   Essential hypertension, benign    bp in fair control at this time  BP Readings  from Last 1 Encounters:  05/16/18 122/76   No changes needed Most recent labs reviewed  Disc lifstyle change with low sodium diet and exercise          Other   Morbid obesity (HCC)    In pt with hx of bariatric surgery  Claims she is sticking to diet well but needs to exercise  Disc strategies to gradually start back with physical activity      Neck pain    Some R sided neck pain that comes on with exertion  Unsure if positional Not bothering her today  Smoker with hx of obesity Reassuring EKG  NSR with rate of 69 and no acute changes  Will eval further if this continues  Disc s/s of angina        Relevant Orders   EKG 12-Lead (Completed)   Smoking    Disc in detail risks of smoking and possible outcomes including copd, vascular/ heart disease, cancer , respiratory and sinus infections  Pt voices understanding She is cutting down now  When ready -wants to quit cold [place]        Relevant Orders   Ambulatory referral to ENT   Sore throat - Primary    2 weeks or more - improved with doxycycline slightly and then worsened  Smoker- 20 y  Some c/o globus sens  No nasal symptoms or fever  Neg RST today and nl exam gerd symptoms are controlled with nexium  Plan ref to ENT for further eval       Relevant Orders   POCT rapid strep A (Completed)   Ambulatory referral to ENT

## 2018-05-16 NOTE — Assessment & Plan Note (Signed)
Some R sided neck pain that comes on with exertion  Unsure if positional Not bothering her today  Smoker with hx of obesity Reassuring EKG  NSR with rate of 69 and no acute changes  Will eval further if this continues  Disc s/s of angina

## 2018-05-16 NOTE — Assessment & Plan Note (Signed)
bp in fair control at this time  BP Readings from Last 1 Encounters:  05/16/18 122/76   No changes needed Most recent labs reviewed  Disc lifstyle change with low sodium diet and exercise

## 2018-05-16 NOTE — Assessment & Plan Note (Signed)
2 weeks or more - improved with doxycycline slightly and then worsened  Smoker- 20 y  Some c/o globus sens  No nasal symptoms or fever  Neg RST today and nl exam gerd symptoms are controlled with nexium  Plan ref to ENT for further eval

## 2018-06-09 ENCOUNTER — Encounter: Payer: Self-pay | Admitting: Family Medicine

## 2018-06-09 ENCOUNTER — Ambulatory Visit (INDEPENDENT_AMBULATORY_CARE_PROVIDER_SITE_OTHER): Payer: 59 | Admitting: Family Medicine

## 2018-06-09 VITALS — BP 126/74 | HR 73 | Temp 98.7°F | Ht 67.0 in | Wt 261.0 lb

## 2018-06-09 DIAGNOSIS — N3000 Acute cystitis without hematuria: Secondary | ICD-10-CM | POA: Insufficient documentation

## 2018-06-09 DIAGNOSIS — R339 Retention of urine, unspecified: Secondary | ICD-10-CM | POA: Diagnosis not present

## 2018-06-09 DIAGNOSIS — R3915 Urgency of urination: Secondary | ICD-10-CM

## 2018-06-09 LAB — POC URINALSYSI DIPSTICK (AUTOMATED)
BILIRUBIN UA: NEGATIVE
Glucose, UA: NEGATIVE
KETONES UA: NEGATIVE
NITRITE UA: POSITIVE
PH UA: 6 (ref 5.0–8.0)
Protein, UA: POSITIVE — AB
SPEC GRAV UA: 1.025 (ref 1.010–1.025)
Urobilinogen, UA: 1 E.U./dL

## 2018-06-09 MED ORDER — SULFAMETHOXAZOLE-TRIMETHOPRIM 800-160 MG PO TABS: 1.0000 | ORAL_TABLET | Freq: Two times a day (BID) | ORAL | 0 refills | Status: DC

## 2018-06-09 MED ORDER — FLUCONAZOLE 150 MG PO TABS: 150.0000 mg | ORAL_TABLET | Freq: Once | ORAL | 0 refills | Status: AC

## 2018-06-09 NOTE — Patient Instructions (Addendum)
You have a urinary/bladder infection Drink lots of water  Take the bactrim ds as directed  Take diflucan if you get a yeast infection  Alert us if no improvement or if worse  We will contact you when urine culture returns    Urinary Tract Infection, Adult A urinary tract infection (UTI) is an infection of any part of the urinary tract, which includes the kidneys, ureters, bladder, and urethra. These organs make, store, and get rid of urine in the body. UTI can be a bladder infection (cystitis) or kidney infection (pyelonephritis). What are the causes? This infection may be caused by fungi, viruses, or bacteria. Bacteria are the most common cause of UTIs. This condition can also be caused by repeated incomplete emptying of the bladder during urination. What increases the risk? This condition is more likely to develop if:  You ignore your need to urinate or hold urine for long periods of time.  You do not empty your bladder completely during urination.  You wipe back to front after urinating or having a bowel movement, if you are female.  You are uncircumcised, if you are female.  You are constipated.  You have a urinary catheter that stays in place (indwelling).  You have a weak defense (immune) system.  You have a medical condition that affects your bowels, kidneys, or bladder.  You have diabetes.  You take antibiotic medicines frequently or for long periods of time, and the antibiotics no longer work well against certain types of infections (antibiotic resistance).  You take medicines that irritate your urinary tract.  You are exposed to chemicals that irritate your urinary tract.  You are female.  What are the signs or symptoms? Symptoms of this condition include:  Fever.  Frequent urination or passing small amounts of urine frequently.  Needing to urinate urgently.  Pain or burning with urination.  Urine that smells bad or unusual.  Cloudy urine.  Pain in the  lower abdomen or back.  Trouble urinating.  Blood in the urine.  Vomiting or being less hungry than normal.  Diarrhea or abdominal pain.  Vaginal discharge, if you are female.  How is this diagnosed? This condition is diagnosed with a medical history and physical exam. You will also need to provide a urine sample to test your urine. Other tests may be done, including:  Blood tests.  Sexually transmitted disease (STD) testing.  If you have had more than one UTI, a cystoscopy or imaging studies may be done to determine the cause of the infections. How is this treated? Treatment for this condition often includes a combination of two or more of the following:  Antibiotic medicine.  Other medicines to treat less common causes of UTI.  Over-the-counter medicines to treat pain.  Drinking enough water to stay hydrated.  Follow these instructions at home:  Take over-the-counter and prescription medicines only as told by your health care provider.  If you were prescribed an antibiotic, take it as told by your health care provider. Do not stop taking the antibiotic even if you start to feel better.  Avoid alcohol, caffeine, tea, and carbonated beverages. They can irritate your bladder.  Drink enough fluid to keep your urine clear or pale yellow.  Keep all follow-up visits as told by your health care provider. This is important.  Make sure to: ? Empty your bladder often and completely. Do not hold urine for long periods of time. ? Empty your bladder before and after sex. ? Wipe from  front to back after a bowel movement if you are female. Use each tissue one time when you wipe. Contact a health care provider if:  You have back pain.  You have a fever.  You feel nauseous or vomit.  Your symptoms do not get better after 3 days.  Your symptoms go away and then return. Get help right away if:  You have severe back pain or lower abdominal pain.  You are vomiting and cannot  keep down any medicines or water. This information is not intended to replace advice given to you by your health care provider. Make sure you discuss any questions you have with your health care provider. Document Released: 08/26/2005 Document Revised: 04/29/2016 Document Reviewed: 10/07/2015 Elsevier Interactive Patient Education  [employer].

## 2018-06-09 NOTE — Assessment & Plan Note (Signed)
Pos ua with voiding symptoms  Inc fluids Bactrim DS Diflucan prn if yeast infx from abx  Continue probiotic Update if not starting to improve in a week or if worsening   Handout given on uti cx pend  Meds ordered this encounter  Medications  . sulfamethoxazole-trimethoprim (BACTRIM DS,SEPTRA DS) 800-160 MG tablet    Sig: Take 1 tablet by mouth 2 (two) times daily.    Dispense:  10 tablet    Refill:  0  . fluconazole (DIFLUCAN) 150 MG tablet    Sig: Take 1 tablet (150 mg total) by mouth once for 1 dose. For yeast infection    Dispense:  1 tablet    Refill:  0

## 2018-06-09 NOTE — Progress Notes (Signed)
Subjective:    Patient ID: Rebekah Benton, female    DOB: Nov 10, 1971, 47 y.o.   MRN: [id number]  HPI Here for urinary symptoms  About 4 days   Feels like she has to urinate Urine has an odor  Not much volume even if she feels strong urge  No visible blood  A little bladder discomfort  Burns to urinate     UA is pos Results for orders placed or performed in visit on 06/09/18  POCT Urinalysis Dipstick (Automated)  Result Value Ref Range   Color, UA Amber    Clarity, UA Cloudy    Glucose, UA Negative Negative   Bilirubin, UA Negative    Ketones, UA Negative    Spec Grav, UA 1.025 1.010 - 1.025   Blood, UA 200 Ery/uL    pH, UA 6.0 5.0 - 8.0   Protein, UA Positive (A) Negative   Urobilinogen, UA 1.0 0.2 or 1.0 E.U./dL   Nitrite, UA Positive    Leukocytes, UA Large (3+) (A) Negative    Patient Active Problem List   Diagnosis Date Noted  . Acute cystitis 06/09/2018  . Sore throat 05/16/2018  . Neck pain 05/16/2018  . Obstructive sleep apnea of adult 12/09/2017  . Left foot pain 10/01/2017  . Encounter for screening mammogram for breast cancer 09/15/2017  . Gastritis 08/30/2017  . Morbid obesity (HCC) 07/19/2016  . Foot pain, bilateral 07/10/2016  . Sleep disorder 03/31/2016  . Routine general medical examination at a health care facility 02/14/2015  . Right knee pain 07/13/2014  . Depression with anxiety 02/26/2014  . Hyperglycemia 02/26/2014  . Essential hypertension, benign 02/26/2014  . Hyperlipidemia 02/26/2014  . Smoking 02/26/2014  . GERD (gastroesophageal reflux disease) 02/26/2014   Past Medical History:  Diagnosis Date  . Depression   . Diabetes mellitus without complication (HCC)   . GERD (gastroesophageal reflux disease)   . Hyperlipidemia   . Hypertension    Past Surgical History:  Procedure Laterality Date  . ABDOMINAL HYSTERECTOMY  2004  . CHOLECYSTECTOMY  2009  . SLEEVE GASTROPLASTY  08/02/2012   Social History   Tobacco Use  .  Smoking status: Current Every Day Smoker    Packs/day: 0.25    Types: Cigarettes  . Smokeless tobacco: Never Used  Substance Use Topics  . Alcohol use: No    Alcohol/week: 0.0 oz  . Drug use: No   Family History  Problem Relation Age of Onset  . Cancer Father        Prostate  . Diabetes Father   . Breast cancer Neg Hx    Allergies  Allergen Reactions  . Ampicillin Rash  . Penicillins Rash   Current Outpatient Medications on File Prior to Visit  Medication Sig Dispense Refill  . atorvastatin (LIPITOR) 80 MG tablet Take 1 tablet (80 mg total) by mouth daily. 90 tablet 3  . ciclopirox (PENLAC) 8 % solution Apply topically at bedtime. Apply over nail and surrounding skin. Apply daily over previous coat. After seven (7) days, may remove with alcohol and continue cycle. 6.6 mL 5  . citalopram (CELEXA) 40 MG tablet Take 40 mg by mouth.    . clonazePAM (KLONOPIN) 1 MG tablet TAKE 1 TABLET BY MOUTH TWICE DAILY AS NEEDED FOR ANXIETY 60 tablet 0  . escitalopram (LEXAPRO) 10 MG tablet Take 1 tablet (10 mg total) by mouth daily. 90 tablet 3  . esomeprazole (NEXIUM) 20 MG capsule Take 20 mg by mouth daily at 12  noon.    . furosemide (LASIX) 20 MG tablet Take 1 tablet (20 mg total) by mouth daily as needed. 30 tablet 3  . HYDROcodone-acetaminophen (LORTAB) 7.5-500 MG/15ML solution Take by mouth.    Marland Kitchen HYDROcodone-homatropine (HYCODAN) 5-1.5 MG/5ML syrup hydrocodone-homatropine 5 mg-1.5 mg/5 mL syrup    . lisinopril (PRINIVIL,ZESTRIL) 10 MG tablet Take 1 tablet (10 mg total) by mouth daily. 90 tablet 3  . metoprolol tartrate (LOPRESSOR) 50 MG tablet Take 1 tablet (50 mg total) by mouth daily. 90 tablet 3  . nitroGLYCERIN (NITRODUR - DOSED IN MG/24 HR) 0.2 mg/hr patch Apply 1/4 of a patch to the affected area and change every 24 hours (re: tendinopathy) 30 patch 4  . sucralfate (CARAFATE) 1 g tablet Take 1 tablet (1 g total) by mouth 4 (four) times daily -  with meals and at bedtime. 90 tablet 0  .  zolpidem (AMBIEN) 10 MG tablet Take 1 tablet (10 mg total) by mouth at bedtime as needed for sleep. 30 tablet 1   No current facility-administered medications on file prior to visit.     Review of Systems  Constitutional: Positive for fatigue. Negative for activity change, appetite change and fever.  HENT: Negative for congestion and sore throat.   Eyes: Negative for itching and visual disturbance.  Respiratory: Negative for cough and shortness of breath.   Cardiovascular: Negative for leg swelling.  Gastrointestinal: Negative for abdominal distention, abdominal pain, constipation, diarrhea and nausea.  Endocrine: Negative for cold intolerance and polydipsia.  Genitourinary: Positive for dysuria, frequency and urgency. Negative for difficulty urinating, flank pain and hematuria.  Musculoskeletal: Negative for myalgias.  Skin: Negative for rash.  Allergic/Immunologic: Negative for immunocompromised state.  Neurological: Negative for dizziness and weakness.  Hematological: Negative for adenopathy.       Objective:   Physical Exam  Constitutional: She appears well-developed and well-nourished. No distress.  overwt and well app  HENT:  Head: Normocephalic and atraumatic.  Eyes: Pupils are equal, round, and reactive to light. Conjunctivae and EOM are normal.  Neck: Normal range of motion. Neck supple.  Cardiovascular: Normal rate, regular rhythm and normal heart sounds.  Pulmonary/Chest: Effort normal and breath sounds normal.  Abdominal: Soft. Bowel sounds are normal. She exhibits no distension. There is tenderness. There is no rebound.  No cva tenderness  Mild suprapubic tenderness  Musculoskeletal: She exhibits no edema.  Lymphadenopathy:    She has no cervical adenopathy.  Neurological: She is alert.  Skin: No rash noted.  Psychiatric: She has a normal mood and affect.  Pleasant           Assessment & Plan:   Problem List Items Addressed This Visit       Genitourinary   Acute cystitis - Primary    Pos ua with voiding symptoms  Inc fluids Bactrim DS Diflucan prn if yeast infx from abx  Continue probiotic Update if not starting to improve in a week or if worsening   Handout given on uti cx pend  Meds ordered this encounter  Medications  . sulfamethoxazole-trimethoprim (BACTRIM DS,SEPTRA DS) 800-160 MG tablet    Sig: Take 1 tablet by mouth 2 (two) times daily.    Dispense:  10 tablet    Refill:  0  . fluconazole (DIFLUCAN) 150 MG tablet    Sig: Take 1 tablet (150 mg total) by mouth once for 1 dose. For yeast infection    Dispense:  1 tablet    Refill:  0  Relevant Orders   Urine Culture    Other Visit Diagnoses    Urinary urgency       Relevant Orders   POCT Urinalysis Dipstick (Automated) (Completed)   Urinary retention       Relevant Orders   POCT Urinalysis Dipstick (Automated) (Completed)

## 2018-06-10 LAB — URINE CULTURE
MICRO NUMBER:: 90822915
SPECIMEN QUALITY: ADEQUATE

## 2018-06-24 ENCOUNTER — Encounter: Payer: Self-pay | Admitting: Family Medicine

## 2018-06-24 MED ORDER — ESOMEPRAZOLE MAGNESIUM 20 MG PO CPDR: 20.0000 mg | DELAYED_RELEASE_CAPSULE | Freq: Every day | ORAL | 11 refills | Status: DC

## 2018-06-24 NOTE — Telephone Encounter (Signed)
nexium otc -pt to use coupon  Sent to pharmacy

## 2018-07-14 ENCOUNTER — Other Ambulatory Visit: Payer: Self-pay | Admitting: Family Medicine

## 2018-07-14 NOTE — Telephone Encounter (Signed)
Name of Medication: Klonopin Name of Pharmacy: [facility] on file Last Fill or Written Date and Quantity: 03/17/18 #60 tabs with 0 efills Last Office Visit and Type: CPE on 09/15/17 and UTI/Yeast inf appt on 06/09/18 Next Office Visit and Type: none scheduled Last Controlled Substance Agreement Date: 12/25/15 Last UDS: 12/25/15

## 2018-08-28 ENCOUNTER — Other Ambulatory Visit: Payer: Self-pay | Admitting: Family Medicine

## 2018-08-29 NOTE — Telephone Encounter (Signed)
Pt had a few recent acute appts but no recent f/u or CPE and no future appts., please advise

## 2018-08-29 NOTE — Telephone Encounter (Signed)
Please schedule a fall or early winter PE and refill until then

## 2018-08-29 NOTE — Telephone Encounter (Signed)
Med filled once and Carrie will reach out to pt to get appt scheduled  

## 2018-10-06 ENCOUNTER — Encounter: Payer: Self-pay | Admitting: Family Medicine

## 2018-10-06 MED ORDER — ESCITALOPRAM OXALATE 10 MG PO TABS: 10.0000 mg | ORAL_TABLET | Freq: Every day | ORAL | 0 refills | Status: DC

## 2018-10-09 ENCOUNTER — Telehealth: Payer: Self-pay | Admitting: Family Medicine

## 2018-10-09 DIAGNOSIS — E785 Hyperlipidemia, unspecified: Secondary | ICD-10-CM

## 2018-10-09 DIAGNOSIS — R7303 Prediabetes: Secondary | ICD-10-CM

## 2018-10-09 DIAGNOSIS — I10 Essential (primary) hypertension: Secondary | ICD-10-CM

## 2018-10-09 NOTE — Telephone Encounter (Signed)
-----   Message from Lauren Greeson, RT sent at 10/04/2018  9:34 AM EST ----- Regarding: Lab orders for Tuesday 10/11/18 Please enter CPE lab orders for 10/11/18. Thanks! 

## 2018-10-11 ENCOUNTER — Other Ambulatory Visit (INDEPENDENT_AMBULATORY_CARE_PROVIDER_SITE_OTHER): Payer: 59

## 2018-10-11 DIAGNOSIS — E785 Hyperlipidemia, unspecified: Secondary | ICD-10-CM | POA: Diagnosis not present

## 2018-10-11 DIAGNOSIS — R7303 Prediabetes: Secondary | ICD-10-CM | POA: Diagnosis not present

## 2018-10-11 DIAGNOSIS — I10 Essential (primary) hypertension: Secondary | ICD-10-CM

## 2018-10-11 LAB — CBC WITH DIFFERENTIAL/PLATELET
BASOS PCT: 0.5 % (ref 0.0–3.0)
Basophils Absolute: 0 10*3/uL (ref 0.0–0.1)
EOS ABS: 0.1 10*3/uL (ref 0.0–0.7)
Eosinophils Relative: 1.5 % (ref 0.0–5.0)
HCT: 43.4 % (ref 36.0–46.0)
Hemoglobin: 14.6 g/dL (ref 12.0–15.0)
LYMPHS ABS: 3.4 10*3/uL (ref 0.7–4.0)
Lymphocytes Relative: 56 % — ABNORMAL HIGH (ref 12.0–46.0)
MCHC: 33.7 g/dL (ref 30.0–36.0)
MCV: 94.6 fl (ref 78.0–100.0)
Monocytes Absolute: 0.5 10*3/uL (ref 0.1–1.0)
Monocytes Relative: 8.9 % (ref 3.0–12.0)
NEUTROS PCT: 33.1 % — AB (ref 43.0–77.0)
Neutro Abs: 2 10*3/uL (ref 1.4–7.7)
PLATELETS: 288 10*3/uL (ref 150.0–400.0)
RBC: 4.58 Mil/uL (ref 3.87–5.11)
RDW: 13 % (ref 11.5–15.5)
WBC: 6.1 10*3/uL (ref 4.0–10.5)

## 2018-10-11 LAB — COMPREHENSIVE METABOLIC PANEL
ALT: 53 U/L — AB (ref 0–35)
AST: 28 U/L (ref 0–37)
Albumin: 4.2 g/dL (ref 3.5–5.2)
Alkaline Phosphatase: 116 U/L (ref 39–117)
BUN: 10 mg/dL (ref 6–23)
CO2: 26 meq/L (ref 19–32)
CREATININE: 0.97 mg/dL (ref 0.40–1.20)
Calcium: 9.7 mg/dL (ref 8.4–10.5)
Chloride: 106 mEq/L (ref 96–112)
GFR: 65.43 mL/min (ref >60.00)
GLUCOSE: 117 mg/dL — AB (ref 70–99)
Potassium: 4.4 mEq/L (ref 3.5–5.1)
SODIUM: 142 meq/L (ref 135–145)
Total Bilirubin: 0.6 mg/dL (ref 0.2–1.2)
Total Protein: 7 g/dL (ref 6.0–8.3)

## 2018-10-11 LAB — LIPID PANEL
Cholesterol: 242 mg/dL — ABNORMAL HIGH (ref 0–200)
HDL: 49.8 mg/dL (ref >39.00)
LDL Cholesterol: 159 mg/dL — ABNORMAL HIGH (ref 0–99)
NONHDL: 191.91
Total CHOL/HDL Ratio: 5
Triglycerides: 166 mg/dL — ABNORMAL HIGH (ref 0.0–149.0)
VLDL: 33.2 mg/dL (ref 0.0–40.0)

## 2018-10-11 LAB — TSH: TSH: 1.13 u[IU]/mL (ref 0.35–4.50)

## 2018-10-11 LAB — HEMOGLOBIN A1C: HEMOGLOBIN A1C: 6.3 % (ref 4.6–6.5)

## 2018-10-17 ENCOUNTER — Ambulatory Visit (INDEPENDENT_AMBULATORY_CARE_PROVIDER_SITE_OTHER): Payer: 59 | Admitting: Family Medicine

## 2018-10-17 ENCOUNTER — Encounter: Payer: Self-pay | Admitting: Family Medicine

## 2018-10-17 VITALS — BP 136/84 | HR 81 | Temp 98.0°F | Ht 66.75 in | Wt 248.2 lb

## 2018-10-17 DIAGNOSIS — R7303 Prediabetes: Secondary | ICD-10-CM | POA: Diagnosis not present

## 2018-10-17 DIAGNOSIS — Z0000 Encounter for general adult medical examination without abnormal findings: Secondary | ICD-10-CM

## 2018-10-17 DIAGNOSIS — E7800 Pure hypercholesterolemia, unspecified: Secondary | ICD-10-CM | POA: Diagnosis not present

## 2018-10-17 DIAGNOSIS — K219 Gastro-esophageal reflux disease without esophagitis: Secondary | ICD-10-CM

## 2018-10-17 DIAGNOSIS — E669 Obesity, unspecified: Secondary | ICD-10-CM

## 2018-10-17 DIAGNOSIS — I10 Essential (primary) hypertension: Secondary | ICD-10-CM | POA: Diagnosis not present

## 2018-10-17 DIAGNOSIS — F418 Other specified anxiety disorders: Secondary | ICD-10-CM

## 2018-10-17 DIAGNOSIS — F17200 Nicotine dependence, unspecified, uncomplicated: Secondary | ICD-10-CM

## 2018-10-17 MED ORDER — LISINOPRIL 10 MG PO TABS: 10.0000 mg | ORAL_TABLET | Freq: Every day | ORAL | 3 refills | Status: DC

## 2018-10-17 MED ORDER — ESCITALOPRAM OXALATE 10 MG PO TABS: 10.0000 mg | ORAL_TABLET | Freq: Every day | ORAL | 3 refills | Status: DC

## 2018-10-17 MED ORDER — ESOMEPRAZOLE MAGNESIUM 20 MG PO CPDR: 20.0000 mg | DELAYED_RELEASE_CAPSULE | Freq: Every day | ORAL | 3 refills | Status: DC

## 2018-10-17 MED ORDER — METOPROLOL TARTRATE 50 MG PO TABS: 50.0000 mg | ORAL_TABLET | Freq: Every day | ORAL | 3 refills | Status: DC

## 2018-10-17 MED ORDER — ROSUVASTATIN CALCIUM 10 MG PO TABS: 10.0000 mg | ORAL_TABLET | Freq: Every day | ORAL | 11 refills | Status: DC

## 2018-10-17 NOTE — Assessment & Plan Note (Signed)
bp in fair control at this time  BP Readings from Last 1 Encounters:  10/17/18 136/84   No changes needed Most recent labs reviewed  Disc lifstyle change with low sodium diet and exercise

## 2018-10-17 NOTE — Assessment & Plan Note (Signed)
Doing well with lexapro 10  Reviewed stressors/ coping techniques/symptoms/ support sources/ tx options and side effects in detail today

## 2018-10-17 NOTE — Assessment & Plan Note (Signed)
Reviewed health habits including diet and exercise and skin cancer prevention Reviewed appropriate screening tests for age  Also reviewed health mt list, fam hx and immunization status , as well as social and family history   See HPI Labs reviewed  She will schedule her own mammogram for December Enc healthy diet and exercise  Will change statin to see if less GI upset

## 2018-10-17 NOTE — Assessment & Plan Note (Signed)
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

## 2018-10-17 NOTE — Assessment & Plan Note (Signed)
Disc in detail risks of smoking and possible outcomes including copd, vascular/ heart disease, cancer , respiratory and sinus infections  Pt voices understanding Not ready to quit yet but contemplating

## 2018-10-17 NOTE — Progress Notes (Signed)
Subjective:    Patient ID: Rebekah Benton, female    DOB: 07/05/1971, 47 y.o.   MRN: [id number]  HPI Here for health maintenance exam and to review chronic medical problems    Wt Readings from Last 3 Encounters:  10/17/18 248 lb 4 oz (112.6 kg)  06/09/18 261 lb (118.4 kg)  05/16/18 260 lb 8 oz (118.2 kg)  h/o bariatric surgery  Going to a place in [place] - weighs less there  Doing well with weight loss  Had gastric sleeve - thinking about transitioning to a gastric bypass (her sleeve causes more acid problems)  39.17 kg/m   Wants to see Dr Augustine Radar at [facility] regarding that possible surgery    Flu vaccine - declines   Mammogram 12/18 - has a reminder - will schedule herself  Self breast exam- no lumps   Tdap 3/15  Smoking status -about 1/2 ppd or less  Not ready to quit yet  May plan a time to quit- she does not crave them as much   bp is stable today  No cp or palpitations or headaches or edema  No side effects to medicines  BP Readings from Last 3 Encounters:  10/17/18 136/84  06/09/18 126/74  05/16/18 122/76     Lab Results  Component Value Date   CREATININE 0.97 10/11/2018   BUN 10 10/11/2018   NA 142 10/11/2018   K 4.4 10/11/2018   CL 106 10/11/2018   CO2 26 10/11/2018   Hyperlipidemia Lab Results  Component Value Date   CHOL 242 (H) 10/11/2018   CHOL 188 09/08/2017   CHOL 152 10/29/2016   Lab Results  Component Value Date   HDL 49.80 10/11/2018   HDL 49.30 09/08/2017   HDL 49.50 10/29/2016   Lab Results  Component Value Date   LDLCALC 159 (H) 10/11/2018   LDLCALC 120 (H) 09/08/2017   LDLCALC 86 10/29/2016   Lab Results  Component Value Date   TRIG 166.0 (H) 10/11/2018   TRIG 92.0 09/08/2017   TRIG 79.0 10/29/2016   Lab Results  Component Value Date   CHOLHDL 5 10/11/2018   CHOLHDL 4 09/08/2017   CHOLHDL 3 10/29/2016   No results found for: LDLDIRECT Atorvastatin 80 mg -was burning her stomach Tried taking it with food -no help      Prediabetes Lab Results  Component Value Date   HGBA1C 6.3 10/11/2018  improved from 6.6  Loosing weight   Lab Results  Component Value Date   WBC 6.1 10/11/2018   HGB 14.6 10/11/2018   HCT 43.4 10/11/2018   MCV 94.6 10/11/2018   PLT 288.0 10/11/2018   Lab Results  Component Value Date   TSH 1.13 10/11/2018   Lab Results  Component Value Date   CREATININE 0.97 10/11/2018   BUN 10 10/11/2018   NA 142 10/11/2018   K 4.4 10/11/2018   CL 106 10/11/2018   CO2 26 10/11/2018   Lab Results  Component Value Date   ALT 53 (H) 10/11/2018   AST 28 10/11/2018   ALKPHOS 116 10/11/2018   BILITOT 0.6 10/11/2018    No etoh Did take acetaminophen before labs   Patient Active Problem List   Diagnosis Date Noted  . Sore throat 05/16/2018  . Neck pain 05/16/2018  . Obstructive sleep apnea of adult 12/09/2017  . Left foot pain 10/01/2017  . Encounter for screening mammogram for breast cancer 09/15/2017  . Gastritis 08/30/2017  . Obesity (BMI 30-39.9) 07/19/2016  .  Foot pain, bilateral 07/10/2016  . Sleep disorder 03/31/2016  . Routine general medical examination at a health care facility 02/14/2015  . Right knee pain 07/13/2014  . Depression with anxiety 02/26/2014  . Prediabetes 02/26/2014  . Essential hypertension, benign 02/26/2014  . Hyperlipidemia 02/26/2014  . Smoking 02/26/2014  . GERD (gastroesophageal reflux disease) 02/26/2014   Past Medical History:  Diagnosis Date  . Depression   . Diabetes mellitus without complication (HCC)   . GERD (gastroesophageal reflux disease)   . Hyperlipidemia   . Hypertension    Past Surgical History:  Procedure Laterality Date  . ABDOMINAL HYSTERECTOMY  2004  . CHOLECYSTECTOMY  2009  . SLEEVE GASTROPLASTY  08/02/2012   Social History   Tobacco Use  . Smoking status: Current Every Day Smoker    Packs/day: 0.25    Types: Cigarettes  . Smokeless tobacco: Never Used  Substance Use Topics  . Alcohol use: No     Alcohol/week: 0.0 standard drinks  . Drug use: No   Family History  Problem Relation Age of Onset  . Cancer Father        Prostate  . Diabetes Father   . Breast cancer Neg Hx    Allergies  Allergen Reactions  . Atorvastatin     GI side effects with the 80 mg   . Ampicillin Rash  . Penicillins Rash   Current Outpatient Medications on File Prior to Visit  Medication Sig Dispense Refill  . ciclopirox (PENLAC) 8 % solution Apply topically at bedtime. Apply over nail and surrounding skin. Apply daily over previous coat. After seven (7) days, may remove with alcohol and continue cycle. 6.6 mL 5  . citalopram (CELEXA) 40 MG tablet Take 40 mg by mouth.    . clonazePAM (KLONOPIN) 1 MG tablet TAKE 1 TABLET BY MOUTH TWICE DAILY AS NEEDED FOR ANXIETY 60 tablet 0  . furosemide (LASIX) 20 MG tablet Take 1 tablet (20 mg total) by mouth daily as needed. 30 tablet 3  . HYDROcodone-acetaminophen (LORTAB) 7.5-500 MG/15ML solution Take by mouth.    Marland Kitchen HYDROcodone-homatropine (HYCODAN) 5-1.5 MG/5ML syrup hydrocodone-homatropine 5 mg-1.5 mg/5 mL syrup    . nitroGLYCERIN (NITRODUR - DOSED IN MG/24 HR) 0.2 mg/hr patch Apply 1/4 of a patch to the affected area and change every 24 hours (re: tendinopathy) 30 patch 4  . zolpidem (AMBIEN) 10 MG tablet Take 1 tablet (10 mg total) by mouth at bedtime as needed for sleep. 30 tablet 1   No current facility-administered medications on file prior to visit.      Review of Systems  Constitutional: Negative for activity change, appetite change, fatigue, fever and unexpected weight change.  HENT: Negative for congestion, ear pain, rhinorrhea, sinus pressure and sore throat.   Eyes: Negative for pain, redness and visual disturbance.  Respiratory: Negative for cough, shortness of breath and wheezing.   Cardiovascular: Negative for chest pain and palpitations.  Gastrointestinal: Positive for nausea. Negative for abdominal pain, blood in stool, constipation and diarrhea.         Heartburn  Occ nausea  Endocrine: Negative for polydipsia and polyuria.  Genitourinary: Negative for dysuria, frequency and urgency.  Musculoskeletal: Negative for arthralgias, back pain and myalgias.  Skin: Negative for pallor and rash.  Allergic/Immunologic: Negative for environmental allergies.  Neurological: Negative for dizziness, syncope and headaches.  Hematological: Negative for adenopathy. Does not bruise/bleed easily.  Psychiatric/Behavioral: Negative for decreased concentration and dysphoric mood. The patient is nervous/anxious.  Objective:   Physical Exam  Constitutional: She appears well-developed and well-nourished. No distress.  obese and well appearing   HENT:  Head: Normocephalic and atraumatic.  Right Ear: External ear normal.  Left Ear: External ear normal.  Mouth/Throat: Oropharynx is clear and moist.  Eyes: Pupils are equal, round, and reactive to light. Conjunctivae and EOM are normal. Right eye exhibits no discharge. Left eye exhibits no discharge. No scleral icterus.  Neck: Normal range of motion. Neck supple. No JVD present. Carotid bruit is not present. No thyromegaly present.  Cardiovascular: Normal rate, regular rhythm, normal heart sounds and intact distal pulses. Exam reveals no gallop.  Pulmonary/Chest: Effort normal and breath sounds normal. No respiratory distress. She has no wheezes. She has no rales. She exhibits no tenderness. No breast tenderness, discharge or bleeding.  Abdominal: Soft. Bowel sounds are normal. She exhibits no distension, no abdominal bruit and no mass. There is no tenderness.  Genitourinary: No breast tenderness, discharge or bleeding.  Musculoskeletal: Normal range of motion. She exhibits no edema, tenderness or deformity.  Lymphadenopathy:    She has no cervical adenopathy.  Neurological: She is alert. She has normal reflexes. She displays normal reflexes. No cranial nerve deficit. She exhibits normal muscle tone.  Coordination normal.  Skin: Skin is warm and dry. No rash noted. No erythema. No pallor.  Solar lentigines diffusely   Psychiatric: Her mood appears anxious.  Pleasant and talkative Mildly anxious           Assessment & Plan:   Problem List Items Addressed This Visit      Cardiovascular and Mediastinum   Essential hypertension, benign    bp in fair control at this time  BP Readings from Last 1 Encounters:  10/17/18 136/84   No changes needed Most recent labs reviewed  Disc lifstyle change with low sodium diet and exercise        Relevant Medications   rosuvastatin (CRESTOR) 10 MG tablet   lisinopril (PRINIVIL,ZESTRIL) 10 MG tablet   metoprolol tartrate (LOPRESSOR) 50 MG tablet     Digestive   GERD (gastroesophageal reflux disease)    Improved some after stopping atorvastatin  Has had a lot of problems since gastric sleeve procedure-she is contemplating a revision to gastric bypass   Enc her to continue PPI [facility]       Relevant Medications   esomeprazole (NEXIUM 24HR) 20 MG capsule   Other Relevant Orders   Ambulatory referral to General Surgery     Other   Depression with anxiety    Doing well with lexapro 10  Reviewed stressors/ coping techniques/symptoms/ support sources/ tx options and side effects in detail today       Relevant Medications   escitalopram (LEXAPRO) 10 MG tablet   Hyperlipidemia    Lipids are up off atorvastatin 80  Side eff- GI Will try crestor 10 mg instead (and update)_ re check 4-6 wk with liver tests (ALT was elevated)       Relevant Medications   rosuvastatin (CRESTOR) 10 MG tablet   lisinopril (PRINIVIL,ZESTRIL) 10 MG tablet   metoprolol tartrate (LOPRESSOR) 50 MG tablet   Obesity (BMI 30-39.9)    Pt is having GERD issues from her gastric sleeve procedure and interested in revision to gastric bypass [facility] Discussed how this problem influences overall  health and the risks it imposes  Reviewed plan for weight loss with lower calorie diet (  via better food choices and also portion control or program like weight watchers) and exercise building up to or more than 30 minutes 5 days per week including some aerobic activity   Commended on wt loss so far       Relevant Orders   Ambulatory referral to General Surgery   Prediabetes    Lab Results  Component Value Date   HGBA1C 6.3 10/11/2018   Improved Enc to keep working on wt loss  disc imp of low glycemic diet and wt loss to prevent DM2       Routine general medical examination at a health care facility - Primary    Reviewed health habits including diet and exercise and skin cancer prevention Reviewed appropriate screening tests for age  Also reviewed health mt list, fam hx and immunization status , as well as social and family history   See HPI Labs reviewed  She will schedule her own mammogram for December Enc healthy diet and exercise  Will change statin to see if less GI upset       Smoking    Disc in detail risks of smoking and possible outcomes including copd, vascular/ heart disease, cancer , respiratory and sinus infections  Pt voices understanding Not ready to quit yet but contemplating

## 2018-10-17 NOTE — Patient Instructions (Addendum)
Don't forget to schedule your mammogram   Continue to hold the atorvastatin  Let's try a different medicine (rosuvastatin -which is generic for crestor) 10 mg once daily  If GI upset or other side effects- stop it and call  Let's check labs in 4-6 weeks   A1C is improving (prediabetes)  Try to get most of your carbohydrates from produce (with the exception of white potatoes)  Eat less bread/pasta/rice/snack foods/cereals/sweets and other items from the middle of the grocery store (processed carbs)

## 2018-10-17 NOTE — Assessment & Plan Note (Signed)
Lab Results  Component Value Date   HGBA1C 6.3 10/11/2018   Improved Enc to keep working on wt loss  disc imp of low glycemic diet and wt loss to prevent DM2

## 2018-10-17 NOTE — Assessment & Plan Note (Signed)
Pt is having GERD issues from her gastric sleeve procedure and interested in revision to gastric bypass [facility] Discussed how this problem influences overall health and the risks it imposes  Reviewed plan for weight loss with lower calorie diet (via better food choices and also portion control or program like weight watchers) and exercise building up to or more than 30 minutes 5 days per week including some aerobic activity   Commended on wt loss so far

## 2018-10-17 NOTE — Assessment & Plan Note (Signed)
Lipids are up off atorvastatin 80  Side eff- GI Will try crestor 10 mg instead (and update)_ re check 4-6 wk with liver tests (ALT was elevated)

## 2018-10-31 ENCOUNTER — Encounter: Payer: Self-pay | Admitting: Family Medicine

## 2018-10-31 ENCOUNTER — Ambulatory Visit: Payer: 59 | Admitting: Family Medicine

## 2018-10-31 ENCOUNTER — Ambulatory Visit (INDEPENDENT_AMBULATORY_CARE_PROVIDER_SITE_OTHER)
Admission: RE | Admit: 2018-10-31 | Discharge: 2018-10-31 | Disposition: A | Discharge status: Home / Self Care | Payer: 59 | Source: Ambulatory Visit | Attending: Family Medicine | Admitting: Family Medicine

## 2018-10-31 VITALS — BP 120/84 | HR 74 | Temp 97.6°F | Ht 66.75 in | Wt 252.5 lb

## 2018-10-31 DIAGNOSIS — M25572 Pain in left ankle and joints of left foot: Secondary | ICD-10-CM

## 2018-10-31 NOTE — Telephone Encounter (Signed)
[facility] so [facility] can [facility]/u with pt regarding referral

## 2018-10-31 NOTE — Progress Notes (Signed)
   Subjective:     Rebekah Benton is a [age] y.o. female presenting for Fall [place] Seat(Fell yesterday 10/30/18, missed a step going down and in the process twisted left foot to the side and landed on it while it was twisted. Pain is present in the left foot and up her left leg. Burning sesantion is present. Difficult to walk. Not able to put weight on it and can not twist/pivot on left foot. Has not applied ice or heat to the affected area.)     Ankle Injury   The incident occurred 12 to 24 hours ago. The incident occurred at home (missed a step). The injury mechanism was an inversion injury. The pain is present in the left foot and left ankle. The quality of the pain is described as burning, shooting and stabbing. The pain is at a severity of 6/10. The pain is severe. The pain has been worsening since onset. Associated symptoms include an inability to bear weight (initially) and a loss of motion. Pertinent negatives include no loss of sensation, numbness or tingling. She reports no foreign bodies present. The symptoms are aggravated by palpation, movement and weight bearing. She has tried rest and non-weight bearing for the symptoms. The treatment provided mild relief.       Review of Systems  Neurological: Negative for tingling and numbness.     Social History   Tobacco Use  Smoking Status Current Every Day Smoker  . Packs/day: 0.25  . Types: Cigarettes  Smokeless Tobacco Never Used        Objective:    BP Readings from Last 3 Encounters:  10/31/18 120/84  10/17/18 136/84  06/09/18 126/74   Wt Readings from Last 3 Encounters:  10/31/18 252 lb 8 oz (114.5 kg)  10/17/18 248 lb 4 oz (112.6 kg)  06/09/18 261 lb (118.4 kg)    BP 120/84   Pulse 74   Temp 97.6 F (36.4 C)   Ht 5' 6.75" (1.695 m)   Wt 252 lb 8 oz (114.5 kg)   SpO2 97%   BMI 39.84 kg/m    Physical Exam  Constitutional: She appears well-developed and well-nourished. No distress.  HENT:  Right Ear: External ear  normal.  Left Ear: External ear normal.  Nose: Nose normal.  Eyes: Conjunctivae and EOM are normal.  Neck: Neck supple.  Cardiovascular: Normal rate.  Pulmonary/Chest: Effort normal.  Musculoskeletal:       Left ankle: She exhibits decreased range of motion and swelling. She exhibits no ecchymosis and normal pulse. Tenderness. Lateral malleolus and medial malleolus tenderness found. No head of 5th metatarsal tenderness found.  Neurological: She is alert.  Skin: Skin is warm and dry. Capillary refill takes less than 2 seconds. She is not diaphoretic.  Psychiatric: She has a normal mood and affect.     Left Ankle XR: no fracture on my review      Assessment & Plan:   Problem List Items Addressed This Visit    None    Visit Diagnoses    Acute left ankle pain    -  Primary   Relevant Orders   DG Ankle Complete Left     No fracture on my review. Will follow-up final read Instructed to get compression sleeve, ice, rest, weight bearing as tolerated, NSAIDS  Return in about 2 weeks (around 11/14/2018), or if symptoms worsen or fail to improve.  Rebekah ChildJessica R Braydon Kullman, MD

## 2018-10-31 NOTE — Patient Instructions (Addendum)
Go to the pharmacy and pick up a compression sleeve for the foot and wear all day - ok to remove for shower See if you can borrow crutches from someone - you want to avoid bearing weight until you can walk w/o pain and normal walk Use Ice 10 minutes 3 times a day Take Iburpofen up to 800 mg every 8 hours as needed for pain  I will let know if the Radiologists sees anything different  Ankle Sprain An ankle sprain is a stretch or tear in one of the tough tissues (ligaments) in your ankle. Follow these instructions at home:  Rest your ankle.  Take over-the-counter and prescription medicines only as told by your doctor.  For 2-3 days, keep your ankle higher than the level of your heart (elevated) as much as possible.  If directed, put ice on the area: ? Put ice in a plastic bag. ? Place a towel between your skin and the bag. ? Leave the ice on for 20 minutes, 2-3 times a day.  If you were given a brace: ? Wear it as told. ? Take it off to shower or bathe. ? Try not to move your ankle much, but wiggle your toes from time to time. This helps to prevent swelling.  If you were given an elastic bandage (dressing): ? Take it off when you shower or bathe. ? Try not to move your ankle much, but wiggle your toes from time to time. This helps to prevent swelling. ? Adjust the bandage to make it more comfortable if it feels too tight. ? Loosen the bandage if you lose feeling in your foot, your foot tingles, or your foot gets cold and blue.  If you have crutches, use them as told by your doctor. Continue to use them until you can walk without feeling pain in your ankle. Contact a doctor if:  Your bruises or swelling are quickly getting worse.  Your pain does not get better after you take medicine. Get help right away if:  You cannot feel your toes or foot.  Your toes or your foot looks blue.  You have very bad pain that gets worse. This information is not intended to replace advice  given to you by your health care provider. Make sure you discuss any questions you have with your health care provider. Document Released: 05/04/2008 Document Revised: 04/23/2016 Document Reviewed: 06/18/2015 Elsevier Interactive Patient Education  [employer]2018 [employer]

## 2018-11-10 ENCOUNTER — Other Ambulatory Visit: Payer: Self-pay | Admitting: Family Medicine

## 2018-11-11 NOTE — Telephone Encounter (Signed)
Name of Pharmacy: [facility] on file Last Fill or Written Date and Quantity: 07/14/18 #60 tabs with 0 efills Last Office Visit and Type: CPE on 10/18/18 Next Office Visit and Type: none scheduled Last Controlled Substance Agreement Date: 12/25/15 Last UDS: 12/25/15

## 2018-11-17 ENCOUNTER — Other Ambulatory Visit: Payer: Self-pay

## 2018-11-28 ENCOUNTER — Other Ambulatory Visit: Payer: Self-pay | Admitting: Family Medicine

## 2018-11-28 DIAGNOSIS — Z1231 Encounter for screening mammogram for malignant neoplasm of breast: Secondary | ICD-10-CM

## 2018-12-01 ENCOUNTER — Telehealth: Payer: Self-pay

## 2018-12-01 NOTE — Telephone Encounter (Signed)
Left message for Misty Stanley at Dr Denita Lung office to call back and check on appointment status for the patient.

## 2018-12-02 ENCOUNTER — Ambulatory Visit: Payer: 59 | Admitting: Family Medicine

## 2018-12-06 DIAGNOSIS — K219 Gastro-esophageal reflux disease without esophagitis: Secondary | ICD-10-CM | POA: Diagnosis not present

## 2018-12-06 DIAGNOSIS — R1110 Vomiting, unspecified: Secondary | ICD-10-CM | POA: Diagnosis not present

## 2018-12-06 DIAGNOSIS — E669 Obesity, unspecified: Secondary | ICD-10-CM | POA: Diagnosis not present

## 2018-12-09 ENCOUNTER — Ambulatory Visit: Payer: 59 | Admitting: Family Medicine

## 2018-12-26 DIAGNOSIS — L732 Hidradenitis suppurativa: Secondary | ICD-10-CM | POA: Diagnosis not present

## 2018-12-30 ENCOUNTER — Ambulatory Visit
Admission: RE | Admit: 2018-12-30 | Discharge: 2018-12-30 | Disposition: A | Discharge status: Home / Self Care | Payer: 59 | Source: Ambulatory Visit | Attending: Family Medicine | Admitting: Family Medicine

## 2018-12-30 DIAGNOSIS — Z1231 Encounter for screening mammogram for malignant neoplasm of breast: Secondary | ICD-10-CM | POA: Diagnosis not present

## 2019-01-11 DIAGNOSIS — K449 Diaphragmatic hernia without obstruction or gangrene: Secondary | ICD-10-CM | POA: Diagnosis not present

## 2019-01-11 DIAGNOSIS — K219 Gastro-esophageal reflux disease without esophagitis: Secondary | ICD-10-CM | POA: Diagnosis not present

## 2019-01-11 DIAGNOSIS — Z9884 Bariatric surgery status: Secondary | ICD-10-CM | POA: Diagnosis not present

## 2019-01-11 DIAGNOSIS — K319 Disease of stomach and duodenum, unspecified: Secondary | ICD-10-CM | POA: Diagnosis not present

## 2019-01-11 DIAGNOSIS — R12 Heartburn: Secondary | ICD-10-CM | POA: Diagnosis not present

## 2019-01-12 DIAGNOSIS — E669 Obesity, unspecified: Secondary | ICD-10-CM | POA: Diagnosis not present

## 2019-01-12 DIAGNOSIS — R1110 Vomiting, unspecified: Secondary | ICD-10-CM | POA: Diagnosis not present

## 2019-01-12 DIAGNOSIS — K219 Gastro-esophageal reflux disease without esophagitis: Secondary | ICD-10-CM | POA: Diagnosis not present

## 2019-01-31 DIAGNOSIS — K449 Diaphragmatic hernia without obstruction or gangrene: Secondary | ICD-10-CM | POA: Diagnosis not present

## 2019-01-31 DIAGNOSIS — I10 Essential (primary) hypertension: Secondary | ICD-10-CM | POA: Diagnosis not present

## 2019-03-14 ENCOUNTER — Encounter: Payer: Self-pay | Admitting: Family Medicine

## 2019-03-15 MED ORDER — CLONAZEPAM 1 MG PO TABS: 1.0000 mg | ORAL_TABLET | Freq: Two times a day (BID) | ORAL | 0 refills | Status: DC | PRN

## 2019-03-15 NOTE — Telephone Encounter (Signed)
See mychart message, all of her meds were refilled for a year on 10/17/18 so I sent a mychart message letting her know. The only med due for refill right now is klonopin.  Name of Medication: Klonopin Name of Pharmacy: [facility] [address] Last Fill or Written Date and Quantity: 11/11/18 #60 tabs with 0 refills Last Office Visit and Type: acute with Dr. Selena Batten on 10/31/18 but had CPE on 10/17/18 Next Office Visit and Type: none scheduled Last Controlled Substance Agreement Date: 12/25/15 Last UDS:12/25/15

## 2019-04-21 ENCOUNTER — Encounter: Payer: Self-pay | Admitting: Family Medicine

## 2019-04-25 MED ORDER — PANTOPRAZOLE SODIUM 40 MG PO TBEC: 40.0000 mg | DELAYED_RELEASE_TABLET | Freq: Every day | ORAL | 3 refills | Status: DC

## 2019-06-05 ENCOUNTER — Encounter: Payer: Self-pay | Admitting: Family Medicine

## 2019-06-05 ENCOUNTER — Ambulatory Visit (INDEPENDENT_AMBULATORY_CARE_PROVIDER_SITE_OTHER): Payer: 59 | Admitting: Family Medicine

## 2019-06-05 DIAGNOSIS — F418 Other specified anxiety disorders: Secondary | ICD-10-CM | POA: Diagnosis not present

## 2019-06-05 MED ORDER — ESCITALOPRAM OXALATE 20 MG PO TABS: 20.0000 mg | ORAL_TABLET | Freq: Every day | ORAL | 3 refills | Status: DC

## 2019-06-05 NOTE — Assessment & Plan Note (Signed)
Much worse with situational stressors (fighting with daughter who left home with boyfriend)  Reviewed stressors/ coping techniques/symptoms/ support sources/ tx options and side effects in detail today  Tearful today but candidly discusses symptoms and stressors No SI  Declines counseling-talks to supportive son Planned to inc lexapro to 20 mg daily  Klonopin prn with caution of sedation and habit  Meditation with apps Enc strongly to write in journal Continue outdoor exercise  Also good self care-disc imp of this  F/u 2-3 wk or earlier if needed Discussed expectations of SSRI medication including time to effectiveness and mechanism of action, also poss of side effects (early and late)- including mental fuzziness, weight or appetite change, nausea and poss of worse dep or anxiety (even suicidal thoughts)  Pt voiced understanding and will stop med and update if this occurs

## 2019-06-05 NOTE — Telephone Encounter (Signed)
appt scheduled today via doxy

## 2019-06-05 NOTE — Patient Instructions (Signed)
Increase your lexapro dose to 20 mg once daily (start today)  Use clonazepam as needed with caution of sedation and habit Avoid caffeine  Walk and do yard work for exercise  Meditate with your app Work hard on self care - this is important  Talk to your son when it is helpful  If you change your mind about counseling- I will set that up, just let me know   The office will call you to set up a follow up appt in 2-3 weeks
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
## 2019-06-08 ENCOUNTER — Encounter: Payer: Self-pay | Admitting: Family Medicine

## 2019-06-27 ENCOUNTER — Ambulatory Visit: Payer: 59 | Admitting: Family Medicine

## 2019-08-10 ENCOUNTER — Other Ambulatory Visit: Payer: Self-pay | Admitting: Family Medicine

## 2019-08-10 NOTE — Telephone Encounter (Signed)
Name of Medication: [place] Name of Pharmacy: [facility] or Written Date and Quantity: 03/15/19 #60 tabs with 0 refills Last Office Visit and Type: 06/05/19 for Anxiety/Depression Next Office Visit and Type: n/a Last Controlled Substance Agreement Date: 07/13/14 Last UDS:07/14/14

## 2019-11-26 ENCOUNTER — Other Ambulatory Visit: Payer: Self-pay | Admitting: Family Medicine

## 2019-12-06 ENCOUNTER — Other Ambulatory Visit: Payer: Self-pay | Admitting: Family Medicine

## 2019-12-21 ENCOUNTER — Telehealth: Payer: Self-pay

## 2019-12-21 NOTE — Telephone Encounter (Signed)
LVM w COVID screen, front door and back lab info 1.21.2021 TLJ  

## 2019-12-25 ENCOUNTER — Telehealth: Payer: Self-pay | Admitting: Family Medicine

## 2019-12-25 DIAGNOSIS — R7303 Prediabetes: Secondary | ICD-10-CM

## 2019-12-25 DIAGNOSIS — I10 Essential (primary) hypertension: Secondary | ICD-10-CM

## 2019-12-25 DIAGNOSIS — E7800 Pure hypercholesterolemia, unspecified: Secondary | ICD-10-CM

## 2019-12-25 NOTE — Telephone Encounter (Signed)
-----   Message from Aquilla Solian, RT sent at 12/15/2019  2:04 PM EST ----- Regarding: Lab Orders for Tuesday 1.26.2021 Please place lab orders for Tuesday 1.26.2021, office visit for physical on Friday 1.29.2021 Thank you, Jones Bales RT(R)

## 2019-12-26 ENCOUNTER — Other Ambulatory Visit: Payer: Self-pay

## 2019-12-26 ENCOUNTER — Other Ambulatory Visit (INDEPENDENT_AMBULATORY_CARE_PROVIDER_SITE_OTHER): Payer: 59

## 2019-12-26 DIAGNOSIS — E7800 Pure hypercholesterolemia, unspecified: Secondary | ICD-10-CM

## 2019-12-26 DIAGNOSIS — I10 Essential (primary) hypertension: Secondary | ICD-10-CM

## 2019-12-26 DIAGNOSIS — R7303 Prediabetes: Secondary | ICD-10-CM | POA: Diagnosis not present

## 2019-12-26 LAB — COMPREHENSIVE METABOLIC PANEL
ALT: 19 U/L (ref 0–35)
AST: 21 U/L (ref 0–37)
Albumin: 4 g/dL (ref 3.5–5.2)
Alkaline Phosphatase: 96 U/L (ref 39–117)
BUN: 12 mg/dL (ref 6–23)
CO2: 26 mEq/L (ref 19–32)
Calcium: 9.3 mg/dL (ref 8.4–10.5)
Chloride: 104 mEq/L (ref 96–112)
Creatinine, Ser: 1.05 mg/dL (ref 0.40–1.20)
GFR: 55.89 mL/min — ABNORMAL LOW (ref >60.00)
Glucose, Bld: 128 mg/dL — ABNORMAL HIGH (ref 70–99)
Potassium: 3.9 mEq/L (ref 3.5–5.1)
Sodium: 138 mEq/L (ref 135–145)
Total Bilirubin: 0.5 mg/dL (ref 0.2–1.2)
Total Protein: 6.5 g/dL (ref 6.0–8.3)

## 2019-12-26 LAB — CBC WITH DIFFERENTIAL/PLATELET
Basophils Absolute: 0 10*3/uL (ref 0.0–0.1)
Basophils Relative: 0.5 % (ref 0.0–3.0)
Eosinophils Absolute: 0.2 10*3/uL (ref 0.0–0.7)
Eosinophils Relative: 2.6 % (ref 0.0–5.0)
HCT: 40.8 % (ref 36.0–46.0)
Hemoglobin: 13.7 g/dL (ref 12.0–15.0)
Lymphocytes Relative: 43.2 % (ref 12.0–46.0)
Lymphs Abs: 3.2 10*3/uL (ref 0.7–4.0)
MCHC: 33.7 g/dL (ref 30.0–36.0)
MCV: 94.6 fl (ref 78.0–100.0)
Monocytes Absolute: 0.5 10*3/uL (ref 0.1–1.0)
Monocytes Relative: 6.8 % (ref 3.0–12.0)
Neutro Abs: 3.5 10*3/uL (ref 1.4–7.7)
Neutrophils Relative %: 46.9 % (ref 43.0–77.0)
Platelets: 260 10*3/uL (ref 150.0–400.0)
RBC: 4.31 Mil/uL (ref 3.87–5.11)
RDW: 12.6 % (ref 11.5–15.5)
WBC: 7.4 10*3/uL (ref 4.0–10.5)

## 2019-12-26 LAB — LIPID PANEL
Cholesterol: 164 mg/dL (ref 0–200)
HDL: 51.7 mg/dL (ref >39.00)
LDL Cholesterol: 87 mg/dL (ref 0–99)
NonHDL: 112.29
Total CHOL/HDL Ratio: 3
Triglycerides: 124 mg/dL (ref 0.0–149.0)
VLDL: 24.8 mg/dL (ref 0.0–40.0)

## 2019-12-26 LAB — HEMOGLOBIN A1C: Hgb A1c MFr Bld: 6.8 % — ABNORMAL HIGH (ref 4.6–6.5)

## 2019-12-26 LAB — TSH: TSH: 1.51 u[IU]/mL (ref 0.35–4.50)

## 2019-12-29 ENCOUNTER — Encounter: Payer: 59 | Admitting: Family Medicine

## 2019-12-29 ENCOUNTER — Telehealth: Payer: Self-pay

## 2019-12-29 NOTE — Telephone Encounter (Signed)
[facility] Night - Client Nonclinical Telephone Record AccessNurse Client [facility] Night - Client Client Site [facility] - Night Physician [place], Idamae Schuller - MD Contact Type Call Who Is Calling Patient / Member / Family / Caregiver Caller Name Rebekah Benton Phone Number [phone] Patient Name Rebekah Benton Patient DOB November 27, 1971 Call Type Message Only Information Provided Reason for Call Request to Reschedule Office Appointment Initial Comment Caller wants to reschedule her appointment. It was for 8:30am today. Additional Comment Office hours were provided. Disp. Time Disposition Final User 12/29/2019 7:41:15 AM General Information Provided Yes Leotis Shames Call Closed By: Leotis Shames Transaction Date/Time: 12/29/2019 7:39:42 AM (ET)

## 2019-12-29 NOTE — Telephone Encounter (Signed)
appt has already been rescheduled.

## 2020-01-09 ENCOUNTER — Encounter: Payer: Self-pay | Admitting: Family Medicine

## 2020-01-09 ENCOUNTER — Ambulatory Visit (INDEPENDENT_AMBULATORY_CARE_PROVIDER_SITE_OTHER): Payer: 59 | Admitting: Family Medicine

## 2020-01-09 ENCOUNTER — Other Ambulatory Visit: Payer: Self-pay

## 2020-01-09 VITALS — BP 116/70 | HR 77 | Temp 97.1°F | Ht 67.5 in | Wt 257.3 lb

## 2020-01-09 DIAGNOSIS — F418 Other specified anxiety disorders: Secondary | ICD-10-CM

## 2020-01-09 DIAGNOSIS — E7800 Pure hypercholesterolemia, unspecified: Secondary | ICD-10-CM

## 2020-01-09 DIAGNOSIS — F17200 Nicotine dependence, unspecified, uncomplicated: Secondary | ICD-10-CM

## 2020-01-09 DIAGNOSIS — R7303 Prediabetes: Secondary | ICD-10-CM

## 2020-01-09 DIAGNOSIS — Z0000 Encounter for general adult medical examination without abnormal findings: Secondary | ICD-10-CM

## 2020-01-09 DIAGNOSIS — Z1231 Encounter for screening mammogram for malignant neoplasm of breast: Secondary | ICD-10-CM

## 2020-01-09 DIAGNOSIS — I10 Essential (primary) hypertension: Secondary | ICD-10-CM | POA: Diagnosis not present

## 2020-01-09 DIAGNOSIS — G479 Sleep disorder, unspecified: Secondary | ICD-10-CM

## 2020-01-09 DIAGNOSIS — K219 Gastro-esophageal reflux disease without esophagitis: Secondary | ICD-10-CM

## 2020-01-09 MED ORDER — FUROSEMIDE 20 MG PO TABS: 20.0000 mg | ORAL_TABLET | Freq: Every day | ORAL | 3 refills | Status: DC | PRN

## 2020-01-09 MED ORDER — LISINOPRIL 10 MG PO TABS: 10.0000 mg | ORAL_TABLET | Freq: Every day | ORAL | 3 refills | Status: DC

## 2020-01-09 MED ORDER — ROSUVASTATIN CALCIUM 10 MG PO TABS: 10.0000 mg | ORAL_TABLET | Freq: Every day | ORAL | 3 refills | Status: DC

## 2020-01-09 MED ORDER — METOPROLOL TARTRATE 50 MG PO TABS: 50.0000 mg | ORAL_TABLET | Freq: Every day | ORAL | 3 refills | Status: DC

## 2020-01-09 MED ORDER — PANTOPRAZOLE SODIUM 40 MG PO TBEC: 40.0000 mg | DELAYED_RELEASE_TABLET | Freq: Every day | ORAL | 3 refills | Status: DC

## 2020-01-09 NOTE — Assessment & Plan Note (Signed)
Disc goals for lipids and reasons to control them Rev last labs with pt Rev low sat fat diet in detail Better with crestor 10 mg -tolerating this well  Diet is now improved also

## 2020-01-09 NOTE — Assessment & Plan Note (Signed)
Doing better with gradual cut down Disc in detail risks of smoking and possible outcomes including copd, vascular/ heart disease, cancer , respiratory and sinus infections  Pt voices understanding Pt is down to less than 1/2 ppd but not ready to quit yet Offered help when she is ready  No copd symptoms at this time

## 2020-01-09 NOTE — Assessment & Plan Note (Signed)
Working on mood/anxiety  Continues lexapro with prn clonazepam  Given handout re: sleep hygiene Enc good self care with more exercise

## 2020-01-09 NOTE — Patient Instructions (Addendum)
Don't forget to schedule your mammogram   Gradually increase exercise -keep pedaling  Look into home exercise- videos or walking outdoors   Try to get most of your carbohydrates from produce (with the exception of white potatoes)  Eat less bread/pasta/rice/snack foods/cereals/sweets and other items from the middle of the grocery store (processed carbs)  Get junk food out of the house best you can   Your sugar is in the diabetic range  We need to check A1c in 3 months  Goal is under 6.5

## 2020-01-09 NOTE — Assessment & Plan Note (Signed)
bp in fair control at this time  BP Readings from Last 1 Encounters:  01/09/20 116/70   No changes needed Most recent labs reviewed  Disc lifstyle change with low sodium diet and exercise

## 2020-01-09 NOTE — Progress Notes (Signed)
Subjective:    Patient ID: Rebekah Benton, female    DOB: 04-07-1971, 49 y.o.   MRN: [id number]  HPI Here for health maintenance exam and to review chronic medical problems    Wt Readings from Last 3 Encounters:  01/09/20 257 lb 5 oz (116.7 kg)  10/31/18 252 lb 8 oz (114.5 kg)  10/17/18 248 lb 4 oz (112.6 kg)  exercise- not a lot  She did buy an under the desk pedaler (uses 20 minutes per day)  39.71 kg/m   Feeling ok and taking care of herself   Hx of sleeve gastric bariatric surgery  Has had some issues with gerd and gastritis  Doing better now - she takes protonix  Has identified some foods that bother her more (spaghetti)   Mammogram 1/20 Self breast exam - no lumps   Had a hysterectomy  No gyn problems   Flu vaccine - declines   Tdap 3/15  Smoking status  Smokes - is down to less than 1/2 ppd  Doing better - has cut way back  Not ready to quit yet - but getting closer  Has not used any nicotine supplementation   bp is stable today  No cp or palpitations or headaches or edema  No side effects to medicines  BP Readings from Last 3 Encounters:  01/09/20 116/70  10/31/18 120/84  10/17/18 136/84     Pulse Readings from Last 3 Encounters:  01/09/20 77  10/31/18 74  10/17/18 81   Prediabetes Lab Results  Component Value Date   HGBA1C 6.8 (H) 12/26/2019  she went "haywire" for a while between jobs - ate anything she wanted  Now is back to a new normal/ habits  Eating better now less than a month  Up from 6.3  Glucose 128 fasting  Some snacking late at night    Hyperlipidemia  Lab Results  Component Value Date   CHOL 164 12/26/2019   CHOL 242 (H) 10/11/2018   CHOL 188 09/08/2017   Lab Results  Component Value Date   HDL 51.70 12/26/2019   HDL 49.80 10/11/2018   HDL 49.30 09/08/2017   Lab Results  Component Value Date   LDLCALC 87 12/26/2019   LDLCALC 159 (H) 10/11/2018   LDLCALC 120 (H) 09/08/2017   Lab Results  Component Value  Date   TRIG 124.0 12/26/2019   TRIG 166.0 (H) 10/11/2018   TRIG 92.0 09/08/2017   Lab Results  Component Value Date   CHOLHDL 3 12/26/2019   CHOLHDL 5 10/11/2018   CHOLHDL 4 09/08/2017   No results found for: LDLDIRECT  crestor 10 mg and diet -tolerating much better than the atorvastatin  Avoids fried foods and beef  No sausage , rarely eats bacon    Mood: depression with anxiety  Taking lexapro  Clonazepam prn  Feeling better overall  Changed a lot in her life - with new career  Working for a much better company  Still does not sleep well at all  occ panic attacks (adjusting to change)  Likes her new later schedule   Other labs  Lab Results  Component Value Date   CREATININE 1.05 12/26/2019   BUN 12 12/26/2019   NA 138 12/26/2019   K 3.9 12/26/2019   CL 104 12/26/2019   CO2 26 12/26/2019   Lab Results  Component Value Date   ALT 19 12/26/2019   AST 21 12/26/2019   ALKPHOS 96 12/26/2019   BILITOT 0.5 12/26/2019  Lab Results  Component Value Date   TSH 1.51 12/26/2019    Lab Results  Component Value Date   WBC 7.4 12/26/2019   HGB 13.7 12/26/2019   HCT 40.8 12/26/2019   MCV 94.6 12/26/2019   PLT 260.0 12/26/2019     Patient Active Problem List   Diagnosis Date Noted  . Obstructive sleep apnea of adult 12/09/2017  . Encounter for screening mammogram for breast cancer 09/15/2017  . Severe obesity (BMI 35.0-39.9) with comorbidity (HCC) 07/19/2016  . Sleep disorder 03/31/2016  . Routine general medical examination at a health care facility 02/14/2015  . Depression with anxiety 02/26/2014  . Prediabetes 02/26/2014  . Essential hypertension, benign 02/26/2014  . Hyperlipidemia 02/26/2014  . Smoking 02/26/2014  . GERD (gastroesophageal reflux disease) 02/26/2014   Past Medical History:  Diagnosis Date  . Depression   . Diabetes mellitus without complication (HCC)   . GERD (gastroesophageal reflux disease)   . Hyperlipidemia   . Hypertension     Past Surgical History:  Procedure Laterality Date  . ABDOMINAL HYSTERECTOMY  2004  . CHOLECYSTECTOMY  2009  . SLEEVE GASTROPLASTY  08/02/2012   Social History   Tobacco Use  . Smoking status: Current Every Day Smoker    Packs/day: 0.25    Types: Cigarettes  . Smokeless tobacco: Never Used  Substance Use Topics  . Alcohol use: No    Alcohol/week: 0.0 standard drinks  . Drug use: No   Family History  Problem Relation Age of Onset  . Cancer Father        Prostate  . Diabetes Father   . Breast cancer Neg Hx    Allergies  Allergen Reactions  . Atorvastatin     GI side effects with the 80 mg   . Ampicillin Rash  . Penicillins Rash   Current Outpatient Medications on File Prior to Visit  Medication Sig Dispense Refill  . clonazePAM (KLONOPIN) 1 MG tablet Take 1 tablet by mouth twice daily as needed for anxiety 60 tablet 0  . escitalopram (LEXAPRO) 20 MG tablet Take 1 tablet (20 mg total) by mouth daily. 90 tablet 3  . nitroGLYCERIN (NITRODUR - DOSED IN MG/24 HR) 0.2 mg/hr patch Apply 1/4 of a patch to the affected area and change every 24 hours (re: tendinopathy) 30 patch 4   No current facility-administered medications on file prior to visit.    Review of Systems  Constitutional: Negative for activity change, appetite change, fatigue, fever and unexpected weight change.  HENT: Negative for congestion, ear pain, rhinorrhea, sinus pressure and sore throat.   Eyes: Negative for pain, redness and visual disturbance.  Respiratory: Positive for cough. Negative for shortness of breath and wheezing.        Occ smoker's cough  Cardiovascular: Negative for chest pain and palpitations.  Gastrointestinal: Negative for abdominal pain, blood in stool, constipation and diarrhea.  Endocrine: Negative for polydipsia and polyuria.  Genitourinary: Negative for dysuria, frequency and urgency.  Musculoskeletal: Negative for arthralgias, back pain and myalgias.  Skin: Negative for pallor and  rash.  Allergic/Immunologic: Negative for environmental allergies.  Neurological: Negative for dizziness, syncope and headaches.  Hematological: Negative for adenopathy. Does not bruise/bleed easily.  Psychiatric/Behavioral: Negative for decreased concentration and dysphoric mood. The patient is nervous/anxious.        Mood is improved       Objective:   Physical Exam Constitutional:      General: She is not in acute distress.    Appearance:  Normal appearance. She is well-developed. She is obese. She is not ill-appearing or diaphoretic.  HENT:     Head: Normocephalic and atraumatic.     Right Ear: Tympanic membrane, ear canal and external ear normal.     Left Ear: Tympanic membrane, ear canal and external ear normal.     Nose: Nose normal. No congestion.     Mouth/Throat:     Mouth: Mucous membranes are moist.     Pharynx: Oropharynx is clear. No posterior oropharyngeal erythema.  Eyes:     General: No scleral icterus.    Extraocular Movements: Extraocular movements intact.     Conjunctiva/sclera: Conjunctivae normal.     Pupils: Pupils are equal, round, and reactive to light.  Neck:     Thyroid: No thyromegaly.     Vascular: No carotid bruit or JVD.  Cardiovascular:     Rate and Rhythm: Normal rate and regular rhythm.     Pulses: Normal pulses.     Heart sounds: Normal heart sounds. No gallop.   Pulmonary:     Effort: Pulmonary effort is normal. No respiratory distress.     Breath sounds: Normal breath sounds. No wheezing.     Comments: Good air exch Chest:     Chest wall: No tenderness.  Abdominal:     General: Bowel sounds are normal. There is no distension or abdominal bruit.     Palpations: Abdomen is soft. There is no mass.     Tenderness: There is no abdominal tenderness.     Hernia: No hernia is present.  Genitourinary:    Comments: Breast exam: No mass, nodules, thickening, tenderness, bulging, retraction, inflamation, nipple discharge or skin changes noted.  No  axillary or clavicular LA.     Musculoskeletal:        General: No tenderness. Normal range of motion.     Cervical back: Normal range of motion and neck supple. No rigidity. No muscular tenderness.     Right lower leg: No edema.     Left lower leg: No edema.  Lymphadenopathy:     Cervical: No cervical adenopathy.  Skin:    General: Skin is warm and dry.     Coloration: Skin is not pale.     Findings: No erythema or rash.     Comments: Solar lentigines diffusely Fair complexion   Neurological:     Mental Status: She is alert. Mental status is at baseline.     Cranial Nerves: No cranial nerve deficit.     Motor: No abnormal muscle tone.     Coordination: Coordination normal.     Gait: Gait normal.     Deep Tendon Reflexes: Reflexes are normal and symmetric. Reflexes normal.  Psychiatric:        Mood and Affect: Mood normal.        Cognition and Memory: Cognition and memory normal.     Comments: Mood is much improved            Assessment & Plan:   Problem List Items Addressed This Visit      Cardiovascular and Mediastinum   Essential hypertension, benign    bp in fair control at this time  BP Readings from Last 1 Encounters:  01/09/20 116/70   No changes needed Most recent labs reviewed  Disc lifstyle change with low sodium diet and exercise        Relevant Medications   furosemide (LASIX) 20 MG tablet   metoprolol tartrate (LOPRESSOR) 50 MG tablet  lisinopril (ZESTRIL) 10 MG tablet   rosuvastatin (CRESTOR) 10 MG tablet     Digestive   GERD (gastroesophageal reflux disease)    Much improved with protonix and better diet  Avoiding foods that worsen symptoms entirely      Relevant Medications   pantoprazole (PROTONIX) 40 MG tablet     Other   Depression with anxiety    Improved with lexapro 20 and prn clonazepam  Still occ panic attack  Still sleep problems but new schedule helps Reviewed stressors/ coping techniques/symptoms/ support sources/ tx  options and side effects in detail today Enc self care and more exercise       Prediabetes    A1C is up after bad eating  Lab Results  Component Value Date   HGBA1C 6.8 (H) 12/26/2019   Now back on track for 2 wk Working on exercise plan- continues to pedal while working  disc imp of low glycemic diet and wt loss to prevent DM2  Will re check A1C in 3 mo -if still over 6.5 will need to discuss treatment       Hyperlipidemia    Disc goals for lipids and reasons to control them Rev last labs with pt Rev low sat fat diet in detail Better with crestor 10 mg -tolerating this well  Diet is now improved also      Relevant Medications   furosemide (LASIX) 20 MG tablet   metoprolol tartrate (LOPRESSOR) 50 MG tablet   lisinopril (ZESTRIL) 10 MG tablet   rosuvastatin (CRESTOR) 10 MG tablet   Smoking    Doing better with gradual cut down Disc in detail risks of smoking and possible outcomes including copd, vascular/ heart disease, cancer , respiratory and sinus infections  Pt voices understanding Pt is down to less than 1/2 ppd but not ready to quit yet Offered help when she is ready  No copd symptoms at this time      Routine general medical examination at a health care facility - Primary    Reviewed health habits including diet and exercise and skin cancer prevention Reviewed appropriate screening tests for age  Also reviewed health mt list, fam hx and immunization status , as well as social and family history   See HPI Labs reviewed  Mammogram ordered-pt will [facility] Has cut back with smoking-enc to quit  Declines flu vaccines          Sleep disorder    Working on mood/anxiety  Continues lexapro with prn clonazepam  Given handout re: sleep hygiene Enc good self care with more exercise       Severe obesity (BMI 35.0-39.9) with comorbidity ([place])    Discussed how this problem influences overall health and the risks it imposes  Reviewed plan for  weight loss with lower calorie diet (via better food choices and also portion control or program like weight watchers) and exercise building up to or more than 30 minutes 5 days per week including some aerobic activity   Recently doing much better with diet (2 wk)  Enc her to inc exercise to at least 5 d per week      Encounter for screening mammogram for breast cancer    Mammogram ordered Pt will schedule Enc self exams  Nl exam today      Relevant Orders   MM 3D SCREEN BREAST BILATERAL

## 2020-01-09 NOTE — Assessment & Plan Note (Signed)
Improved with lexapro 20 and prn clonazepam  Still occ panic attack  Still sleep problems but new schedule helps Reviewed stressors/ coping techniques/symptoms/ support sources/ tx options and side effects in detail today Enc self care and more exercise

## 2020-01-09 NOTE — Assessment & Plan Note (Signed)
Discussed how this problem influences overall health and the risks it imposes  Reviewed plan for weight loss with lower calorie diet (via better food choices and also portion control or program like weight watchers) and exercise building up to or more than 30 minutes 5 days per week including some aerobic activity   Recently doing much better with diet (2 wk)  Enc her to inc exercise to at least 5 d per week

## 2020-01-09 NOTE — Assessment & Plan Note (Signed)
A1C is up after bad eating  Lab Results  Component Value Date   HGBA1C 6.8 (H) 12/26/2019   Now back on track for 2 wk Working on exercise plan- continues to pedal while working  disc imp of low glycemic diet and wt loss to prevent DM2  Will re check A1C in 3 mo -if still over 6.5 will need to discuss treatment

## 2020-01-09 NOTE — Assessment & Plan Note (Signed)
Mammogram ordered Pt will schedule Enc self exams  Nl exam today

## 2020-01-09 NOTE — Assessment & Plan Note (Signed)
Much improved with protonix and better diet  Avoiding foods that worsen symptoms entirely

## 2020-01-09 NOTE — Assessment & Plan Note (Signed)
Reviewed health habits including diet and exercise and skin cancer prevention Reviewed appropriate screening tests for age  Also reviewed health mt list, fam hx and immunization status , as well as social and family history   See HPI Labs reviewed  Mammogram ordered-pt will schedule the appt at [facility] Has cut back with smoking-enc to quit  Declines flu vaccines

## 2020-02-02 ENCOUNTER — Other Ambulatory Visit: Payer: Self-pay | Admitting: Family Medicine

## 2020-02-02 NOTE — Telephone Encounter (Signed)
Dr. Milinda Antis out of the office, will route to another provider.  Name of Medication: Klonopin Name of Pharmacy: [facility] [address] Last Fill or Written Date and Quantity: 08/10/19 #60 tabs with 0 refills Last Office Visit and Type: CPE on 01/09/20 Next Office Visit and Type: n/a  Last Controlled Substance Agreement Date: 07/13/14 Last UDS:07/14/14

## 2020-02-04 NOTE — Telephone Encounter (Signed)
Sent. Thanks.   

## 2020-04-08 ENCOUNTER — Other Ambulatory Visit (INDEPENDENT_AMBULATORY_CARE_PROVIDER_SITE_OTHER): Payer: 59

## 2020-04-08 DIAGNOSIS — R7303 Prediabetes: Secondary | ICD-10-CM

## 2020-04-08 LAB — POCT GLYCOSYLATED HEMOGLOBIN (HGB A1C): Hemoglobin A1C: 6.4 % — AB (ref 4.0–5.6)

## 2020-04-10 ENCOUNTER — Ambulatory Visit
Admission: RE | Admit: 2020-04-10 | Discharge: 2020-04-10 | Disposition: A | Discharge status: Home / Self Care | Payer: 59 | Source: Ambulatory Visit | Attending: Family Medicine | Admitting: Family Medicine

## 2020-04-10 DIAGNOSIS — Z1231 Encounter for screening mammogram for malignant neoplasm of breast: Secondary | ICD-10-CM | POA: Insufficient documentation

## 2020-05-20 ENCOUNTER — Encounter: Payer: Self-pay | Admitting: Family Medicine

## 2020-05-22 ENCOUNTER — Telehealth: Payer: Self-pay | Admitting: *Deleted

## 2020-05-22 NOTE — Telephone Encounter (Signed)
Aware, will review when I return

## 2020-05-22 NOTE — Telephone Encounter (Signed)
Pt had labs done at another office. She scanned them into her mychart. I printed them out and placed them in PCP's inbox for review once she returns.

## 2020-06-26 ENCOUNTER — Other Ambulatory Visit: Payer: Self-pay | Admitting: Family Medicine

## 2020-06-28 ENCOUNTER — Encounter: Payer: Self-pay | Admitting: Family Medicine

## 2020-07-09 ENCOUNTER — Other Ambulatory Visit: Payer: Self-pay | Admitting: Family Medicine

## 2020-07-11 NOTE — Telephone Encounter (Signed)
Name of Medication: Klonopin Name of Pharmacy: [facility] [address] Last Fill or Written Date and Quantity: 02/04/20 #60 tabs 0 refills Last Office Visit and Type: CPE on 01/09/20 Next Office Visit and Type: none scheduled

## 2020-07-26 ENCOUNTER — Encounter: Payer: Self-pay | Admitting: Family Medicine

## 2020-07-26 DIAGNOSIS — G4733 Obstructive sleep apnea (adult) (pediatric): Secondary | ICD-10-CM

## 2020-07-29 NOTE — Telephone Encounter (Signed)
Ref for sleep eval

## 2020-07-30 NOTE — Telephone Encounter (Signed)
Noted  

## 2020-10-04 ENCOUNTER — Other Ambulatory Visit: Payer: Self-pay | Admitting: Family Medicine

## 2021-01-08 ENCOUNTER — Telehealth: Payer: Self-pay | Admitting: Family Medicine

## 2021-01-09 NOTE — Telephone Encounter (Signed)
I left a message on patient's voice mail to return call.  If patient returns call, please schedule cpx in the spring.

## 2021-01-09 NOTE — Telephone Encounter (Signed)
Patient called back and scheduled cpx on 03/10/21.

## 2021-01-09 NOTE — Telephone Encounter (Signed)
Name of Medication: Klonopin Name of Pharmacy: [facility] [address] Last Fill or Written Date and Quantity: 07/11/20 #60 tabs 0 refills Last Office Visit and Type: CPE on 01/09/20 Next Office Visit and Type: none scheduled (hasn't been seen in over a year)   Lexapro last filled on 10/07/20 #90 tabs 0 refills

## 2021-01-09 NOTE — Telephone Encounter (Signed)
I sent refills  Please schedule PE in the spring

## 2021-03-01 ENCOUNTER — Other Ambulatory Visit: Payer: Self-pay | Admitting: Family Medicine

## 2021-03-02 ENCOUNTER — Telehealth: Payer: Self-pay | Admitting: Family Medicine

## 2021-03-02 DIAGNOSIS — R7303 Prediabetes: Secondary | ICD-10-CM

## 2021-03-02 DIAGNOSIS — I10 Essential (primary) hypertension: Secondary | ICD-10-CM

## 2021-03-02 DIAGNOSIS — E7800 Pure hypercholesterolemia, unspecified: Secondary | ICD-10-CM

## 2021-03-02 NOTE — Telephone Encounter (Signed)
-----   Message from Aquilla Solian, RT sent at 02/17/2021 11:35 AM EDT ----- Regarding: Lab Orders for Monday 4.4.2022 Please place lab orders for Monday 4.4.2022, office visit for physical on Thursday 4.7.2022 Thank you, Jones Bales RT(R)

## 2021-03-03 ENCOUNTER — Other Ambulatory Visit (INDEPENDENT_AMBULATORY_CARE_PROVIDER_SITE_OTHER): Payer: 59

## 2021-03-03 ENCOUNTER — Other Ambulatory Visit: Payer: Self-pay

## 2021-03-03 DIAGNOSIS — I10 Essential (primary) hypertension: Secondary | ICD-10-CM

## 2021-03-03 DIAGNOSIS — E7800 Pure hypercholesterolemia, unspecified: Secondary | ICD-10-CM | POA: Diagnosis not present

## 2021-03-03 DIAGNOSIS — R7303 Prediabetes: Secondary | ICD-10-CM

## 2021-03-03 LAB — CBC WITH DIFFERENTIAL/PLATELET
Basophils Absolute: 0 10*3/uL (ref 0.0–0.1)
Basophils Relative: 0.5 % (ref 0.0–3.0)
Eosinophils Absolute: 0.4 10*3/uL (ref 0.0–0.7)
Eosinophils Relative: 5.3 % — ABNORMAL HIGH (ref 0.0–5.0)
HCT: 42.6 % (ref 36.0–46.0)
Hemoglobin: 14.4 g/dL (ref 12.0–15.0)
Lymphocytes Relative: 49.5 % — ABNORMAL HIGH (ref 12.0–46.0)
Lymphs Abs: 3.9 10*3/uL (ref 0.7–4.0)
MCHC: 33.8 g/dL (ref 30.0–36.0)
MCV: 94.1 fl (ref 78.0–100.0)
Monocytes Absolute: 0.6 10*3/uL (ref 0.1–1.0)
Monocytes Relative: 7.7 % (ref 3.0–12.0)
Neutro Abs: 2.9 10*3/uL (ref 1.4–7.7)
Neutrophils Relative %: 37 % — ABNORMAL LOW (ref 43.0–77.0)
Platelets: 266 10*3/uL (ref 150.0–400.0)
RBC: 4.52 Mil/uL (ref 3.87–5.11)
RDW: 12.8 % (ref 11.5–15.5)
WBC: 7.9 10*3/uL (ref 4.0–10.5)

## 2021-03-03 LAB — COMPREHENSIVE METABOLIC PANEL
ALT: 28 U/L (ref 0–35)
AST: 26 U/L (ref 0–37)
Albumin: 4.2 g/dL (ref 3.5–5.2)
Alkaline Phosphatase: 95 U/L (ref 39–117)
BUN: 14 mg/dL (ref 6–23)
CO2: 32 mEq/L (ref 19–32)
Calcium: 9.7 mg/dL (ref 8.4–10.5)
Chloride: 103 mEq/L (ref 96–112)
Creatinine, Ser: 1.06 mg/dL (ref 0.40–1.20)
GFR: 61.75 mL/min (ref >60.00)
Glucose, Bld: 134 mg/dL — ABNORMAL HIGH (ref 70–99)
Potassium: 4.6 mEq/L (ref 3.5–5.1)
Sodium: 141 mEq/L (ref 135–145)
Total Bilirubin: 0.5 mg/dL (ref 0.2–1.2)
Total Protein: 6.5 g/dL (ref 6.0–8.3)

## 2021-03-03 LAB — LIPID PANEL
Cholesterol: 172 mg/dL (ref 0–200)
HDL: 57.2 mg/dL (ref >39.00)
LDL Cholesterol: 85 mg/dL (ref 0–99)
NonHDL: 115.28
Total CHOL/HDL Ratio: 3
Triglycerides: 150 mg/dL — ABNORMAL HIGH (ref 0.0–149.0)
VLDL: 30 mg/dL (ref 0.0–40.0)

## 2021-03-03 LAB — HEMOGLOBIN A1C: Hgb A1c MFr Bld: 6.7 % — ABNORMAL HIGH (ref 4.6–6.5)

## 2021-03-03 LAB — TSH: TSH: 1.63 u[IU]/mL (ref 0.35–4.50)

## 2021-03-06 ENCOUNTER — Ambulatory Visit (INDEPENDENT_AMBULATORY_CARE_PROVIDER_SITE_OTHER): Payer: 59 | Admitting: Family Medicine

## 2021-03-06 ENCOUNTER — Encounter: Payer: Self-pay | Admitting: Family Medicine

## 2021-03-06 ENCOUNTER — Other Ambulatory Visit: Payer: Self-pay

## 2021-03-06 VITALS — BP 128/70 | HR 73 | Temp 96.9°F | Ht 66.75 in | Wt 257.4 lb

## 2021-03-06 DIAGNOSIS — I10 Essential (primary) hypertension: Secondary | ICD-10-CM | POA: Diagnosis not present

## 2021-03-06 DIAGNOSIS — F418 Other specified anxiety disorders: Secondary | ICD-10-CM

## 2021-03-06 DIAGNOSIS — Z0000 Encounter for general adult medical examination without abnormal findings: Secondary | ICD-10-CM

## 2021-03-06 DIAGNOSIS — Z1211 Encounter for screening for malignant neoplasm of colon: Secondary | ICD-10-CM | POA: Insufficient documentation

## 2021-03-06 DIAGNOSIS — F17200 Nicotine dependence, unspecified, uncomplicated: Secondary | ICD-10-CM

## 2021-03-06 DIAGNOSIS — K219 Gastro-esophageal reflux disease without esophagitis: Secondary | ICD-10-CM | POA: Diagnosis not present

## 2021-03-06 DIAGNOSIS — E7800 Pure hypercholesterolemia, unspecified: Secondary | ICD-10-CM

## 2021-03-06 DIAGNOSIS — E119 Type 2 diabetes mellitus without complications: Secondary | ICD-10-CM

## 2021-03-06 MED ORDER — FUROSEMIDE 20 MG PO TABS: 20.0000 mg | ORAL_TABLET | Freq: Every day | ORAL | 3 refills | Status: DC | PRN

## 2021-03-06 MED ORDER — LISINOPRIL 10 MG PO TABS: 10.0000 mg | ORAL_TABLET | Freq: Every day | ORAL | 3 refills | Status: DC

## 2021-03-06 MED ORDER — BUPROPION HCL ER (XL) 150 MG PO TB24: 150.0000 mg | ORAL_TABLET | Freq: Every day | ORAL | 11 refills | Status: DC

## 2021-03-06 MED ORDER — ROSUVASTATIN CALCIUM 10 MG PO TABS: 10.0000 mg | ORAL_TABLET | Freq: Every day | ORAL | 3 refills | Status: DC

## 2021-03-06 MED ORDER — PANTOPRAZOLE SODIUM 40 MG PO TBEC: 40.0000 mg | DELAYED_RELEASE_TABLET | Freq: Every day | ORAL | 3 refills | Status: DC

## 2021-03-06 MED ORDER — METOPROLOL TARTRATE 50 MG PO TABS: 50.0000 mg | ORAL_TABLET | Freq: Every day | ORAL | 3 refills | Status: DC

## 2021-03-06 MED ORDER — ESCITALOPRAM OXALATE 20 MG PO TABS: 1.0000 | ORAL_TABLET | Freq: Every day | ORAL | 3 refills | Status: DC

## 2021-03-06 MED ORDER — OZEMPIC (0.25 OR 0.5 MG/DOSE) 2 MG/1.5ML ~~LOC~~ SOPN: 0.2500 mg | PEN_INJECTOR | SUBCUTANEOUS | 3 refills | Status: DC

## 2021-03-06 NOTE — Assessment & Plan Note (Signed)
Disc in detail risks of smoking and possible outcomes including copd, vascular/ heart disease, cancer , respiratory and sinus infections  Pt voices understanding Is smoking less Not motivated to quit yet

## 2021-03-06 NOTE — Assessment & Plan Note (Signed)
Disc goals for lipids and reasons to control them Rev last labs with pt Rev low sat fat diet in detail  Plan to continue crestor 10 mg daily  LDL of 85

## 2021-03-06 NOTE — Assessment & Plan Note (Signed)
Lab Results  Component Value Date   HGBA1C 6.7 (H) 03/03/2021   This is down from 6.8 Pt chooses to stay off medication (disc metformin or GLP-1 for wt loss)-may consider later  F/u planned

## 2021-03-06 NOTE — Progress Notes (Signed)
Subjective:    Patient ID: Rebekah Benton, female    DOB: 07-07-1971, 50 y.o.   MRN: [id number]  This visit occurred during the SARS-CoV-2 public health emergency.  Safety protocols were in place, including screening questions prior to the visit, additional usage of staff PPE, and extensive cleaning of exam room while observing appropriate contact time as indicated for disinfecting solutions.    HPI  Here for health maintenance exam and to review chronic medical problems    Wt Readings from Last 3 Encounters:  03/06/21 257 lb 7 oz (116.8 kg)  01/09/20 257 lb 5 oz (116.7 kg)  10/31/18 252 lb 8 oz (114.5 kg)   40.62 kg/m  Doing ok overall physically  More down lately  Some anxiety/worry and also unmotivated and down  She went to [facility] to help with weight loss- did not want to use hormones (s/p total hysterectomy)  Took testosterone for a while (helped energy)  She still felt hungry all the time   Has had bariatric surgery    Tdap 3/15 covid immunized  Smoking status - same , but there are days she does not want to smoke and does not   Colon cancer screening - not interested yet  Mammogram 5/21, plans to schedule it  Self breast exam-no lumps   HTN bp is stable today  No cp or palpitations or headaches or edema  No side effects to medicines  BP Readings from Last 3 Encounters:  03/06/21 128/70  01/09/20 116/70  10/31/18 120/84    Lisinopril 10 mg and metoprolol 50 mg daily   Pulse Readings from Last 3 Encounters:  03/06/21 73  01/09/20 77  10/31/18 74   Hyperlipidemia Lab Results  Component Value Date   CHOL 172 03/03/2021   CHOL 164 12/26/2019   CHOL 242 (H) 10/11/2018   Lab Results  Component Value Date   HDL 57.20 03/03/2021   HDL 51.70 12/26/2019   HDL 49.80 10/11/2018   Lab Results  Component Value Date   LDLCALC 85 03/03/2021   LDLCALC 87 12/26/2019   LDLCALC 159 (H) 10/11/2018   Lab Results  Component Value Date   TRIG 150.0 (H)  03/03/2021   TRIG 124.0 12/26/2019   TRIG 166.0 (H) 10/11/2018   Lab Results  Component Value Date   CHOLHDL 3 03/03/2021   CHOLHDL 3 12/26/2019   CHOLHDL 5 10/11/2018   No results found for: LDLDIRECT crestor 10 mg daily  Exercise- has a horse and care is active   Prediabetes Lab Results  Component Value Date   HGBA1C 6.7 (H) 03/03/2021  this is down from 6.8  Diabetic now  Would like to avoid medication   Mood affects appetite and motivation is low   Lab Results  Component Value Date   CREATININE 1.06 03/03/2021   BUN 14 03/03/2021   NA 141 03/03/2021   K 4.6 03/03/2021   CL 103 03/03/2021   CO2 32 03/03/2021   Lab Results  Component Value Date   ALT 28 03/03/2021   AST 26 03/03/2021   ALKPHOS 95 03/03/2021   BILITOT 0.5 03/03/2021   Lab Results  Component Value Date   WBC 7.9 03/03/2021   HGB 14.4 03/03/2021   HCT 42.6 03/03/2021   MCV 94.1 03/03/2021   PLT 266.0 03/03/2021   Lab Results  Component Value Date   TSH 1.63 03/03/2021     Patient Active Problem List   Diagnosis Date Noted  . Colon cancer  screening 03/06/2021  . Obstructive sleep apnea of adult 12/09/2017  . Encounter for screening mammogram for breast cancer 09/15/2017  . Morbid obesity (HCC) 07/19/2016  . Sleep disorder 03/31/2016  . Routine general medical examination at a health care facility 02/14/2015  . Depression with anxiety 02/26/2014  . Type 2 diabetes mellitus without complications (HCC) 02/26/2014  . Essential hypertension, benign 02/26/2014  . Hyperlipidemia 02/26/2014  . Smoking 02/26/2014  . GERD (gastroesophageal reflux disease) 02/26/2014   Past Medical History:  Diagnosis Date  . Depression   . Diabetes mellitus without complication (HCC)   . GERD (gastroesophageal reflux disease)   . Hyperlipidemia   . Hypertension    Past Surgical History:  Procedure Laterality Date  . ABDOMINAL HYSTERECTOMY  2004  . CHOLECYSTECTOMY  2009  . SLEEVE GASTROPLASTY   08/02/2012   Social History   Tobacco Use  . Smoking status: Current Every Day Smoker    Packs/day: 0.25    Types: Cigarettes  . Smokeless tobacco: Never Used  Substance Use Topics  . Alcohol use: No    Alcohol/week: 0.0 standard drinks  . Drug use: No   Family History  Problem Relation Age of Onset  . Cancer Father        Prostate  . Diabetes Father   . Breast cancer Neg Hx    Allergies  Allergen Reactions  . Atorvastatin     GI side effects with the 80 mg   . Ampicillin Rash  . Penicillins Rash   Current Outpatient Medications on File Prior to Visit  Medication Sig Dispense Refill  . clonazePAM (KLONOPIN) 1 MG tablet Take 1 tablet by mouth twice daily as needed for anxiety 60 tablet 0  . nitroGLYCERIN (NITRODUR - DOSED IN MG/24 HR) 0.2 mg/hr patch Apply 1/4 of a patch to the affected area and change every 24 hours (re: tendinopathy) 30 patch 4   No current facility-administered medications on file prior to visit.      Review of Systems  Constitutional: Positive for fatigue. Negative for activity change, appetite change, fever and unexpected weight change.  HENT: Negative for congestion, ear pain, rhinorrhea, sinus pressure and sore throat.   Eyes: Negative for pain, redness and visual disturbance.  Respiratory: Negative for cough, shortness of breath and wheezing.   Cardiovascular: Negative for chest pain and palpitations.  Gastrointestinal: Negative for abdominal pain, blood in stool, constipation and diarrhea.  Endocrine: Negative for polydipsia and polyuria.  Genitourinary: Negative for dysuria, frequency and urgency.  Musculoskeletal: Negative for arthralgias, back pain and myalgias.  Skin: Negative for pallor and rash.  Allergic/Immunologic: Negative for environmental allergies.  Neurological: Negative for dizziness, syncope and headaches.  Hematological: Negative for adenopathy. Does not bruise/bleed easily.  Psychiatric/Behavioral: Positive for dysphoric  mood. Negative for decreased concentration and suicidal ideas. The patient is nervous/anxious.        Objective:   Physical Exam Constitutional:      General: She is not in acute distress.    Appearance: Normal appearance. She is well-developed. She is obese. She is not ill-appearing or diaphoretic.  HENT:     Head: Normocephalic and atraumatic.     Right Ear: Tympanic membrane, ear canal and external ear normal.     Left Ear: Tympanic membrane, ear canal and external ear normal.     Nose: Nose normal. No congestion.     Mouth/Throat:     Mouth: Mucous membranes are moist.     Pharynx: Oropharynx is clear. No posterior  oropharyngeal erythema.  Eyes:     General: No scleral icterus.    Extraocular Movements: Extraocular movements intact.     Conjunctiva/sclera: Conjunctivae normal.     Pupils: Pupils are equal, round, and reactive to light.  Neck:     Thyroid: No thyromegaly.     Vascular: No carotid bruit or JVD.  Cardiovascular:     Rate and Rhythm: Normal rate and regular rhythm.     Pulses: Normal pulses.     Heart sounds: Normal heart sounds. No gallop.   Pulmonary:     Effort: Pulmonary effort is normal. No respiratory distress.     Breath sounds: Normal breath sounds. No wheezing.     Comments: Good air exch Chest:     Chest wall: No tenderness.  Abdominal:     General: Bowel sounds are normal. There is no distension or abdominal bruit.     Palpations: Abdomen is soft. There is no mass.     Tenderness: There is no abdominal tenderness.     Hernia: No hernia is present.  Genitourinary:    Comments: Breast exam: No mass, nodules, thickening, tenderness, bulging, retraction, inflamation, nipple discharge or skin changes noted.  No axillary or clavicular LA.     Musculoskeletal:        General: No tenderness. Normal range of motion.     Cervical back: Normal range of motion and neck supple. No rigidity. No muscular tenderness.     Right lower leg: No edema.     Left  lower leg: No edema.  Lymphadenopathy:     Cervical: No cervical adenopathy.  Skin:    General: Skin is warm and dry.     Coloration: Skin is not pale.     Findings: No erythema or rash.     Comments: Solar lentigines diffusely   Neurological:     Mental Status: She is alert. Mental status is at baseline.     Cranial Nerves: No cranial nerve deficit.     Motor: No abnormal muscle tone.     Coordination: Coordination normal.     Gait: Gait normal.     Deep Tendon Reflexes: Reflexes are normal and symmetric. Reflexes normal.  Psychiatric:        Mood and Affect: Mood is depressed.        Thought Content: Thought content does not include suicidal ideation.        Cognition and Memory: Cognition and memory normal.           Assessment & Plan:   Problem List Items Addressed This Visit      Cardiovascular and Mediastinum   Essential hypertension, benign    bp in fair control at this time  BP Readings from Last 1 Encounters:  03/06/21 128/70   No changes needed Most recent labs reviewed  Disc lifstyle change with low sodium diet and exercise  Plan to continue lisinopril 10 mg daily and metoprolol 50 mg daily       Relevant Medications   furosemide (LASIX) 20 MG tablet   lisinopril (ZESTRIL) 10 MG tablet   metoprolol tartrate (LOPRESSOR) 50 MG tablet   rosuvastatin (CRESTOR) 10 MG tablet     Digestive   GERD (gastroesophageal reflux disease)    Continues protonix 40 mg daily  Wt loss end      Relevant Medications   pantoprazole (PROTONIX) 40 MG tablet     Endocrine   Type 2 diabetes mellitus without complications (HCC)  Lab Results  Component Value Date   HGBA1C 6.7 (H) 03/03/2021   This is down from 6.8 Pt chooses to stay off medication (disc metformin or GLP-1 for wt loss)-may consider later  F/u planned        Relevant Medications   lisinopril (ZESTRIL) 10 MG tablet   rosuvastatin (CRESTOR) 10 MG tablet     Other   Depression with anxiety     Struggling more with mood and motivation Reviewed stressors/ coping techniques/symptoms/ support sources/ tx options and side effects in detail today Taking lexapro 20 mg daily  Will add wellbutrin xl 150 mg daily for mood and also smoking/overeating  Disc poss side eff  Will call if problems or if no imp or worse      Relevant Medications   escitalopram (LEXAPRO) 20 MG tablet   Hyperlipidemia    Disc goals for lipids and reasons to control them Rev last labs with pt Rev low sat fat diet in detail  Plan to continue crestor 10 mg daily  LDL of 85      Relevant Medications   furosemide (LASIX) 20 MG tablet   lisinopril (ZESTRIL) 10 MG tablet   metoprolol tartrate (LOPRESSOR) 50 MG tablet   rosuvastatin (CRESTOR) 10 MG tablet   Smoking    Disc in detail risks of smoking and possible outcomes including copd, vascular/ heart disease, cancer , respiratory and sinus infections  Pt voices understanding Is smoking less Not motivated to quit yet      Routine general medical examination at a health care facility - Primary    Reviewed health habits including diet and exercise and skin cancer prevention Reviewed appropriate screening tests for age  Also reviewed health mt list, fam hx and immunization status , as well as social and family history   See HPI Labs reviewed  covid immunized  Not ready to quit smoking but doing better  Not interested in colon cancer screening  Plans to schedule her mammogram       Morbid obesity (HCC)    Discussed how this problem influences overall health and the risks it imposes  Reviewed plan for weight loss with lower calorie diet (via better food choices and also portion control or program like weight watchers) and exercise building up to or more than 30 minutes 5 days per week including some aerobic activity   Offered ref to healthy wt and wellness clinic semaglutide or similar med may be helpful also      Colon cancer screening    Pt declines  colon cancer screening today

## 2021-03-06 NOTE — Assessment & Plan Note (Signed)
Pt declines colon cancer screening today

## 2021-03-06 NOTE — Assessment & Plan Note (Signed)
bp in fair control at this time  BP Readings from Last 1 Encounters:  03/06/21 128/70   No changes needed Most recent labs reviewed  Disc lifstyle change with low sodium diet and exercise  Plan to continue lisinopril 10 mg daily and metoprolol 50 mg daily

## 2021-03-06 NOTE — Telephone Encounter (Signed)
We start slow with ozempic 0.25 mg weekly for the first month and then let me know how she is doing  Education officer, environmental

## 2021-03-06 NOTE — Patient Instructions (Addendum)
A medicine like Semaglutide would help with blood sugar and weight loss   If you are interested in the healthy [facility] clinic at [facility] let us know   Consider adding wellbutrin xl 150 mg daily for depression (may help smoking and overeating)   You are due for a mammogram after may 21   Get a diabetic eye exam yearly   Follow up in 6 months

## 2021-03-06 NOTE — Assessment & Plan Note (Signed)
Struggling more with mood and motivation Reviewed stressors/ coping techniques/symptoms/ support sources/ tx options and side effects in detail today Taking lexapro 20 mg daily  Will add wellbutrin xl 150 mg daily for mood and also smoking/overeating  Disc poss side eff  Will call if problems or if no imp or worse

## 2021-03-06 NOTE — Assessment & Plan Note (Signed)
Continues protonix 40 mg daily  Wt loss end

## 2021-03-06 NOTE — Assessment & Plan Note (Signed)
Reviewed health habits including diet and exercise and skin cancer prevention Reviewed appropriate screening tests for age  Also reviewed health mt list, fam hx and immunization status , as well as social and family history   See HPI Labs reviewed  covid immunized  Not ready to quit smoking but doing better  Not interested in colon cancer screening  Plans to schedule her mammogram

## 2021-03-06 NOTE — Assessment & Plan Note (Signed)
Discussed how this problem influences overall health and the risks it imposes  Reviewed plan for weight loss with lower calorie diet (via better food choices and also portion control or program like weight watchers) and exercise building up to or more than 30 minutes 5 days per week including some aerobic activity   Offered ref to healthy wt and wellness clinic semaglutide or similar med may be helpful also

## 2021-03-06 NOTE — Telephone Encounter (Signed)
For any prior auth you have to send in Rx 1st then the pharmacy will fax [place] over the prior auth info once they received Rx

## 2021-03-10 NOTE — Telephone Encounter (Signed)
PA done at [email or web address], I will await a response  

## 2021-03-13 MED ORDER — OZEMPIC (0.25 OR 0.5 MG/DOSE) 2 MG/1.5ML ~~LOC~~ SOPN
0.2500 mg | PEN_INJECTOR | SUBCUTANEOUS | 0 refills | Status: DC
Start: 2021-03-13 — End: 2021-03-26

## 2021-03-17 ENCOUNTER — Telehealth: Payer: Self-pay | Admitting: Family Medicine

## 2021-03-17 NOTE — Telephone Encounter (Signed)
I pended px to send to pharmacy of choice Unsure if it will be covered Please send for whatever supplies needed

## 2021-03-17 NOTE — Addendum Note (Signed)
Addended by: Roxy Manns A on: 03/17/2021 10:29 PM   Modules accepted: Orders

## 2021-03-17 NOTE — Telephone Encounter (Signed)
Patient is interested in the [facility] 2 to check her blood sugar as the meter she has is hurting her fingers. Please advise. EM

## 2021-03-18 MED ORDER — FREESTYLE LIBRE 2 SENSOR MISC: 0 refills | Status: DC

## 2021-03-18 MED ORDER — FREESTYLE LIBRE 2 READER DEVI: 0 refills | Status: AC

## 2021-03-18 NOTE — Telephone Encounter (Signed)
Rx sent, pt said she gets one free month of supplies with a coupon the the manufacture so she will get this one and then check regarding price/coverage with pharmacy

## 2021-03-18 NOTE — Addendum Note (Signed)
Addended by: Shon Millet on: 03/18/2021 04:17 PM   Modules accepted: Orders

## 2021-03-26 ENCOUNTER — Encounter: Payer: Self-pay | Admitting: Family Medicine

## 2021-03-26 MED ORDER — OZEMPIC (0.25 OR 0.5 MG/DOSE) 2 MG/1.5ML ~~LOC~~ SOPN: 0.5000 mg | PEN_INJECTOR | SUBCUTANEOUS | 3 refills | Status: DC

## 2021-03-26 MED ORDER — FREESTYLE LIBRE 2 SENSOR MISC: 3 refills | Status: DC

## 2021-03-31 NOTE — Telephone Encounter (Signed)
And the other number is [phone] that's [employer] rx. And they stated that if they call today it can be sent out today.

## 2021-03-31 NOTE — Telephone Encounter (Signed)
I started to PA process but it doesn't proceed if pt isn't on insulin. It just will not cover it, so I didn't complete the PA, please review pt's mychart message

## 2021-03-31 NOTE — Telephone Encounter (Signed)
Pt stated that they can call the [employer] PA line and the number [phone]

## 2021-04-01 ENCOUNTER — Other Ambulatory Visit: Payer: Self-pay | Admitting: Family Medicine

## 2021-04-01 NOTE — Telephone Encounter (Signed)
Pt called in and stated that they are wanting to have the doctor call the phone number [phone].

## 2021-04-01 NOTE — Telephone Encounter (Addendum)
See pt's comments and also see pt's pics under media. I have tried to proceed with PA despite saying no to the insulin question but I can't answer the other questions without provider input given A1c was only 6.7 and one question is asking if pt is required to test 4 times daily with regular meter. I have cut an pasted the questions they are asking and need a yes or no answer for all 5 questions before I can try and again proceed with PA. Please review and advise   Is the patient motivated and knowledgeable about the use of continuous glucose monitoring, is adherent to diabetic treatment plan, and participates in ongoing education and support?  Is the patient on an intensive insulin regimen (3 or more insulin injections per day or uses continuous subcutaneous insulin infusion pump)?  Does the patient have inadequate glycemic control despite intensive diabetes management?  Does the patient regularly monitor blood glucose 4 or more times per day?   Is there documentation of positive clinical response?

## 2021-04-01 NOTE — Telephone Encounter (Signed)
[phone]- is the PA number

## 2021-04-02 NOTE — Telephone Encounter (Signed)
Dr. Royden Purl response sent directly to me said:  She is motivated  No insulin and not uncontrolled and not checking 4 times per day  She has seen clinical benefit  Thanks   I put that info in [email or web address] and will await a response.

## 2021-04-04 ENCOUNTER — Other Ambulatory Visit: Payer: Self-pay | Admitting: *Deleted

## 2021-04-04 MED ORDER — FREESTYLE LIBRE 2 SENSOR MISC: 0 refills | Status: DC

## 2021-05-09 ENCOUNTER — Encounter: Payer: Self-pay | Admitting: Family Medicine

## 2021-05-09 ENCOUNTER — Ambulatory Visit (INDEPENDENT_AMBULATORY_CARE_PROVIDER_SITE_OTHER): Payer: 59 | Admitting: Family Medicine

## 2021-05-09 ENCOUNTER — Other Ambulatory Visit: Payer: Self-pay

## 2021-05-09 DIAGNOSIS — L03319 Cellulitis of trunk, unspecified: Secondary | ICD-10-CM | POA: Diagnosis not present

## 2021-05-09 DIAGNOSIS — L02219 Cutaneous abscess of trunk, unspecified: Secondary | ICD-10-CM | POA: Diagnosis not present

## 2021-05-09 MED ORDER — CLINDAMYCIN HCL 300 MG PO CAPS: 300.0000 mg | ORAL_CAPSULE | Freq: Three times a day (TID) | ORAL | 0 refills | Status: DC

## 2021-05-09 NOTE — Assessment & Plan Note (Signed)
Cellulitis in oval area 3-4 cm between breasts  Not fluctuant (so not candidate for I and D)  Px clindamycin 300 mg tid for 10 d  Start warm compresses  Keep clean with soap and water  Enc drainage if it does so  F/u next wk for re check  inst to call earlier if worse / ER parameters disc

## 2021-05-09 NOTE — Patient Instructions (Signed)
Use a warm compress when you can  Keep clean with soap and water  If it drains on its own, keep it clean   Follow up next week for a recheck  Cancel the day before if it is better  If worse - let us know right away - bigger red and swollen area or streaks of redness   Start the cleocin 300 mg three times daily

## 2021-05-09 NOTE — Progress Notes (Signed)
Subjective:    Patient ID: Rebekah Benton, female    DOB: 11/05/1971, 50 y.o.   MRN: [id number]  This visit occurred during the SARS-CoV-2 public health emergency.  Safety protocols were in place, including screening questions prior to the visit, additional usage of staff PPE, and extensive cleaning of exam room while observing appropriate contact time as indicated for disinfecting solutions.   HPI Pt presents for c/o cyst on chest   Wt Readings from Last 3 Encounters:  05/09/21 254 lb 1 oz (115.2 kg)  03/06/21 257 lb 7 oz (116.8 kg)  01/09/20 257 lb 5 oz (116.7 kg)   40.09 kg/m Current smoker   Has a red/painful area mid chest bet breasts  Sore Saturday  Then more red -spreading out   Has not drained  No white head on it   Patient Active Problem List   Diagnosis Date Noted   Cellulitis and abscess of trunk 05/09/2021   Colon cancer screening 03/06/2021   Obstructive sleep apnea of adult 12/09/2017   Encounter for screening mammogram for breast cancer 09/15/2017   Morbid obesity (HCC) 07/19/2016   Sleep disorder 03/31/2016   Routine general medical examination at a health care facility 02/14/2015   Depression with anxiety 02/26/2014   Type 2 diabetes mellitus without complications (HCC) 02/26/2014   Essential hypertension, benign 02/26/2014   Hyperlipidemia 02/26/2014   Smoking 02/26/2014   GERD (gastroesophageal reflux disease) 02/26/2014   Past Medical History:  Diagnosis Date   Depression    Diabetes mellitus without complication (HCC)    GERD (gastroesophageal reflux disease)    Hyperlipidemia    Hypertension    Past Surgical History:  Procedure Laterality Date   ABDOMINAL HYSTERECTOMY  2004   CHOLECYSTECTOMY  2009   SLEEVE GASTROPLASTY  08/02/2012   Social History   Tobacco Use   Smoking status: Every Day    Packs/day: 0.25    Pack years: 0.00    Types: Cigarettes   Smokeless tobacco: Never  Substance Use Topics   Alcohol use: No     Alcohol/week: 0.0 standard drinks   Drug use: No   Family History  Problem Relation Age of Onset   Cancer Father        Prostate   Diabetes Father    Breast cancer Neg Hx    Allergies  Allergen Reactions   Atorvastatin     GI side effects with the 80 mg    Ampicillin Rash   Penicillins Rash   Current Outpatient Medications on File Prior to Visit  Medication Sig Dispense Refill   buPROPion (WELLBUTRIN XL) 150 MG 24 hr tablet Take 1 tablet (150 mg total) by mouth daily. 30 tablet 11   clonazePAM (KLONOPIN) 1 MG tablet Take 1 tablet by mouth twice daily as needed for anxiety 60 tablet 0   Continuous Blood Gluc Receiver (FREESTYLE LIBRE 2 READER) DEVI Use as directed to monitor glucose for DM type 2 (dx. E11.9) 1 each 0   Continuous Blood Gluc Sensor (FREESTYLE LIBRE 2 SENSOR) MISC USE AS DIRECTED TO MONITOR GLUCOSE FOR TYPE 2 DM 6 each 0   escitalopram (LEXAPRO) 20 MG tablet Take 1 tablet (20 mg total) by mouth daily. 90 tablet 3   furosemide (LASIX) 20 MG tablet Take 1 tablet (20 mg total) by mouth daily as needed. 30 tablet 3   lisinopril (ZESTRIL) 10 MG tablet Take 1 tablet (10 mg total) by mouth daily. 90 tablet 3   metoprolol tartrate (LOPRESSOR)  50 MG tablet Take 1 tablet (50 mg total) by mouth daily. 90 tablet 3   nitroGLYCERIN (NITRODUR - DOSED IN MG/24 HR) 0.2 mg/hr patch Apply 1/4 of a patch to the affected area and change every 24 hours (re: tendinopathy) 30 patch 4   pantoprazole (PROTONIX) 40 MG tablet Take 1 tablet (40 mg total) by mouth daily. 90 tablet 3   rosuvastatin (CRESTOR) 10 MG tablet Take 1 tablet (10 mg total) by mouth daily. 90 tablet 3   Semaglutide,0.25 or 0.5MG /DOS, (OZEMPIC, 0.25 OR 0.5 MG/DOSE,) 2 MG/1.5ML SOPN Inject 0.5 mg into the skin once a week. 4.5 mL 3   No current facility-administered medications on file prior to visit.     Review of Systems  Constitutional:  Negative for activity change, appetite change, fatigue, fever and unexpected weight  change.  HENT:  Negative for congestion, ear pain, rhinorrhea, sinus pressure and sore throat.   Eyes:  Negative for pain, redness and visual disturbance.  Respiratory:  Negative for cough, shortness of breath and wheezing.   Cardiovascular:  Negative for chest pain and palpitations.  Gastrointestinal:  Negative for abdominal pain, blood in stool, constipation and diarrhea.  Endocrine: Negative for polydipsia and polyuria.  Genitourinary:  Negative for dysuria, frequency and urgency.  Musculoskeletal:  Negative for arthralgias, back pain and myalgias.  Skin:  Positive for color change and wound. Negative for pallor and rash.  Allergic/Immunologic: Negative for environmental allergies.  Neurological:  Negative for dizziness, syncope and headaches.  Hematological:  Negative for adenopathy. Does not bruise/bleed easily.  Psychiatric/Behavioral:  Negative for decreased concentration and dysphoric mood. The patient is not nervous/anxious.       Objective:   Physical Exam Constitutional:      General: She is not in acute distress.    Appearance: Normal appearance. She is obese. She is not ill-appearing.  Eyes:     Conjunctiva/sclera: Conjunctivae normal.     Pupils: Pupils are equal, round, and reactive to light.  Cardiovascular:     Rate and Rhythm: Normal rate and regular rhythm.     Heart sounds: Normal heart sounds.  Pulmonary:     Effort: Pulmonary effort is normal. No respiratory distress.     Breath sounds: Normal breath sounds. No wheezing.  Musculoskeletal:     Cervical back: Normal range of motion and neck supple. No tenderness.  Lymphadenopathy:     Cervical: No cervical adenopathy.  Skin:    Findings: Erythema present.     Comments: 3 by 4 cm oval area of erythema and firm induration between breasts on anterior mid chest  Warm to the touch and tender Not fluctuant  No drainage or opening  No streaks or extension   Neurological:     Mental Status: She is alert.      Cranial Nerves: No cranial nerve deficit.  Psychiatric:        Mood and Affect: Mood is anxious.     Comments: pleasant          Assessment & Plan:   Problem List Items Addressed This Visit       Other   Cellulitis and abscess of trunk    Cellulitis in oval area 3-4 cm between breasts  Not fluctuant (so not candidate for I and D)  Px clindamycin 300 mg tid for 10 d  Start warm compresses  Keep clean with soap and water  Enc drainage if it does so  F/u next wk for re check  inst  to call earlier if worse / ER parameters disc

## 2021-05-13 ENCOUNTER — Encounter: Payer: Self-pay | Admitting: Family Medicine

## 2021-05-13 MED ORDER — FLUCONAZOLE 150 MG PO TABS: ORAL_TABLET | ORAL | 0 refills | Status: DC

## 2021-05-14 ENCOUNTER — Ambulatory Visit: Payer: 59 | Admitting: Family Medicine

## 2021-06-09 ENCOUNTER — Telehealth: Payer: Self-pay

## 2021-06-09 NOTE — Telephone Encounter (Signed)
Tried to contact pt but no answer and VM has not been set up yet. 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 Routing to triage nurses to f/u on msg.

## 2021-06-09 NOTE — Telephone Encounter (Signed)
[facility] Day - Client TELEPHONE ADVICE RECORD AccessNurse Patient Name: Rebekah Benton ON Gender: Female DOB: Apr 23, 1971 Age: 50 Y 7 M 20 D Return Phone Number: [phone] (Primary) Address: City/ State/ Zip: [place] [address] Client [facility] Day - Client Client Site  [facility] [place] - Day Physician Tower, Idamae Schuller - MD Contact Type Call Who Is Calling Patient / Member / Family / Caregiver Call Type Triage / Clinical Relationship To Patient Self Return Phone Number [phone] (Primary) Chief Complaint Abdominal Pain Reason for Call Symptomatic / Request for Health Information Initial Comment Caller states she is experiencing abdominal pain. Translation No Nurse Assessment Nurse: Freida Busman, RN, Diane Date/Time Lamount Cohen Time): 06/09/2021 11:17:49 AM Confirm and document reason for call. If symptomatic, describe symptoms. ---Caller states she is having abd. pain. States there is a lump under her skin. Lump is near the belly button. Symptoms off and on x 3 years. No n/v. Pain is a 3/10. Does the patient have any new or worsening symptoms? ---Yes Will a triage be completed? ---Yes Related visit to physician within the last 2 weeks? ---No Does the PT have any chronic conditions? (i.e. diabetes, asthma, this includes High risk factors for pregnancy, etc.) ---Yes List chronic conditions. ---DM Is the patient pregnant or possibly pregnant? (Ask all females between the ages of 83-55) ---No Is this a behavioral health or substance abuse call? ---No Guidelines Guideline Title Affirmed Question Affirmed Notes Nurse Date/Time Lamount Cohen Time) Abdominal Pain - Female [1] MILDMODERATE pain AND [2] constant AND [3] present > 2 hours Freida Busman, RN, Diane 06/09/2021 11:21:34 AM Disp. Time Lamount Cohen Time) Disposition Final User PLEASE NOTE: All timestamps contained within this report are represented as [place] Standard  Time. CONFIDENTIALTY NOTICE: This fax transmission is intended only for the addressee. It contains information that is legally privileged, confidential or otherwise protected from use or disclosure. If you are not the intended recipient, you are strictly prohibited from reviewing, disclosing, copying using or disseminating any of this information or taking any action in reliance on or regarding this information. If you have received this fax in error, please notify us immediately by telephone so that we can arrange for its return to [place]. Phone: [phone], Toll-Free: [phone], Fax: [phone] Page: 2 of 2 Call Id: [id number] 06/09/2021 11:23:21 AM See HCP within 4 Hours (or PCP triage) Yes Freida Busman, RN, Diane Caller Disagree/Comply Comply Caller Understands Yes PreDisposition Did not know what to do Care Advice Given Per Guideline SEE HCP (OR PCP TRIAGE) WITHIN 4 HOURS: * IF OFFICE WILL BE CLOSED AND NO PCP (PRIMARY CARE PROVIDER) SECOND-LEVEL TRIAGE: You need to be seen within the next 3 or 4 hours. A nearby Urgent Care Center [facility]) is often a good source of care. Another choice is to go to the ED. Go sooner if you become worse. CALL BACK IF: * You become worse CARE ADVICE given per Abdominal Pain, Female (Adult) guideline. Comments User: Matt Holmes, RN Date/Time Lamount Cohen Time): 06/09/2021 11:29:14 AM Warm transferred pt. for appt. Referrals GO TO FACILITY UNDECIDED

## 2021-06-10 ENCOUNTER — Other Ambulatory Visit: Payer: Self-pay | Admitting: Family Medicine

## 2021-06-10 NOTE — Telephone Encounter (Signed)
I spoke with pt; pt said she has appt with DR Milinda Antis next Friday. Offered pt sooner appt. But pt said she wants to wait until appt already scheduled. Pt said she thinks she knows it is like a deep cyst that will come to surface and this has been going on for 3 years. Abx does not get rid of lump in abd. Pt is not having any fever and no N&V.Pain level now is 2 and it comes and goes. The abd pain comes and go and it is described as a soreness as if she had been exercising. Pt said Dr Milinda Antis is very aware of this condition and only wants to see her. Pt said she would keep appt on 06/20/21 at 9 AM. UC & ED precautions given and pt voiced understanding. Sending note as FYI to DR [employer] and Doctor, hospital.

## 2021-06-11 ENCOUNTER — Other Ambulatory Visit: Payer: Self-pay | Admitting: Family Medicine

## 2021-06-20 ENCOUNTER — Ambulatory Visit: Payer: 59 | Admitting: Family Medicine

## 2021-07-13 ENCOUNTER — Other Ambulatory Visit: Payer: Self-pay | Admitting: Family Medicine

## 2021-07-15 NOTE — Telephone Encounter (Signed)
Name of Medication: Klonopin Name of Pharmacy: [facility] [address] Last Fill or Written Date and Quantity: 01/09/21 #60 tabs 0 refills Last Office Visit and Type: CPE on 03/06/21 Next Office Visit and Type: none scheduled

## 2021-08-12 ENCOUNTER — Encounter: Payer: Self-pay | Admitting: Family Medicine

## 2021-09-05 ENCOUNTER — Other Ambulatory Visit: Payer: Self-pay

## 2021-09-05 ENCOUNTER — Other Ambulatory Visit (INDEPENDENT_AMBULATORY_CARE_PROVIDER_SITE_OTHER): Payer: 59

## 2021-09-05 ENCOUNTER — Other Ambulatory Visit: Payer: 59

## 2021-09-05 DIAGNOSIS — E7800 Pure hypercholesterolemia, unspecified: Secondary | ICD-10-CM | POA: Diagnosis not present

## 2021-09-05 DIAGNOSIS — E119 Type 2 diabetes mellitus without complications: Secondary | ICD-10-CM

## 2021-09-05 DIAGNOSIS — I10 Essential (primary) hypertension: Secondary | ICD-10-CM | POA: Diagnosis not present

## 2021-09-05 LAB — COMPREHENSIVE METABOLIC PANEL
ALT: 16 U/L (ref 0–35)
AST: 18 U/L (ref 0–37)
Albumin: 4.3 g/dL (ref 3.5–5.2)
Alkaline Phosphatase: 94 U/L (ref 39–117)
BUN: 17 mg/dL (ref 6–23)
CO2: 28 mEq/L (ref 19–32)
Calcium: 9.6 mg/dL (ref 8.4–10.5)
Chloride: 103 mEq/L (ref 96–112)
Creatinine, Ser: 1.06 mg/dL (ref 0.40–1.20)
GFR: 61.53 mL/min (ref >60.00)
Glucose, Bld: 108 mg/dL — ABNORMAL HIGH (ref 70–99)
Potassium: 4.2 mEq/L (ref 3.5–5.1)
Sodium: 139 mEq/L (ref 135–145)
Total Bilirubin: 0.7 mg/dL (ref 0.2–1.2)
Total Protein: 6.7 g/dL (ref 6.0–8.3)

## 2021-09-05 LAB — LIPID PANEL
Cholesterol: 161 mg/dL (ref 0–200)
HDL: 47.3 mg/dL (ref >39.00)
LDL Cholesterol: 81 mg/dL (ref 0–99)
NonHDL: 113.64
Total CHOL/HDL Ratio: 3
Triglycerides: 165 mg/dL — ABNORMAL HIGH (ref 0.0–149.0)
VLDL: 33 mg/dL (ref 0.0–40.0)

## 2021-09-05 LAB — HEMOGLOBIN A1C: Hgb A1c MFr Bld: 6.2 % (ref 4.6–6.5)

## 2021-09-16 ENCOUNTER — Encounter: Payer: Self-pay | Admitting: Family Medicine

## 2021-09-16 DIAGNOSIS — G4733 Obstructive sleep apnea (adult) (pediatric): Secondary | ICD-10-CM

## 2021-10-14 NOTE — Progress Notes (Signed)
@Patient  ID: , female    DOB: 1971/03/24, 50 y.o.   MRN: [id number]  No chief complaint on file.   Referring provider: Tower, [id number], MD  HPI: 50 year old female, current every day smoker. PMH significant for OSA, HTN, GERD, type 2 diabetes.   10/15/2021 Patient presents today for sleep consult. She was dx with sleep apnea in 2009. She has not used her CPAP machine in > 10 years. She reports symptoms of loud snoring, restless sleep and daytime fatigue/sleepiness. She feels tired all the time and can take a nap at any moment. She works from home. She has fallen asleep at her desk before and woken her self up snoring. Denies symptoms of narcolepsy, cataplexy or sleep walking.   Sleep questionnaire Prior sleep study- < 10 years ago (2009) Symptoms- Loud snoring, witneessed apnea, restless sleep, daytime sleepiness Bedtime- 10pm-12am Time to fall asleep- 1-2 hours Nocturnal awakenings- 3+ Out of bed in morning- 5-9am Weight changes- 10 lbs  Epworth score- 15  Allergies  Allergen Reactions   Atorvastatin     GI side effects with the 80 mg    Ampicillin Rash   Penicillins Rash    Immunization History  Administered Date(s) Administered   PFIZER(Purple Top)SARS-COV-2 Vaccination 05/17/2020, 06/22/2020   Tdap 02/26/2014    Past Medical History:  Diagnosis Date   Depression    Diabetes mellitus without complication (HCC)    GERD (gastroesophageal reflux disease)    Hyperlipidemia    Hypertension     Tobacco History: Social History   Tobacco Use  Smoking Status Every Day   Packs/day: 0.50   Types: Cigarettes  Smokeless Tobacco Never  Tobacco Comments   Pt smoke less than  half a pack a day.   Ready to quit: Not Answered Counseling given: Not Answered Tobacco comments: Pt smoke less than  half a pack a day.   Outpatient Medications Prior to Visit  Medication Sig Dispense Refill   clonazePAM (KLONOPIN) 1 MG tablet Take 1 tablet by mouth twice  daily as needed for anxiety 60 tablet 0   Continuous Blood Gluc Receiver (FREESTYLE LIBRE 2 READER) DEVI Use as directed to monitor glucose for DM type 2 (dx. E11.9) 1 each 0   Continuous Blood Gluc Sensor (FREESTYLE LIBRE 2 SENSOR) MISC USE AS DIRECTED TO MONITOR GLUCOSE FOR TYPE 2 DM 6 each 0   escitalopram (LEXAPRO) 20 MG tablet Take 1 tablet (20 mg total) by mouth daily. 90 tablet 3   lisinopril (ZESTRIL) 10 MG tablet Take 1 tablet (10 mg total) by mouth daily. 90 tablet 3   metoprolol tartrate (LOPRESSOR) 50 MG tablet Take 1 tablet by mouth once daily 90 tablet 2   pantoprazole (PROTONIX) 40 MG tablet Take 1 tablet (40 mg total) by mouth daily. 90 tablet 3   rosuvastatin (CRESTOR) 10 MG tablet Take 1 tablet (10 mg total) by mouth daily. 90 tablet 3   buPROPion (WELLBUTRIN XL) 150 MG 24 hr tablet Take 1 tablet (150 mg total) by mouth daily. 30 tablet 11   clindamycin (CLEOCIN) 300 MG capsule Take 1 capsule (300 mg total) by mouth 3 (three) times daily. 30 capsule 0   fluconazole (DIFLUCAN) 150 MG tablet Take one pill by mouth now and one in 3 days for yeast infection 2 tablet 0   furosemide (LASIX) 20 MG tablet Take 1 tablet (20 mg total) by mouth daily as needed. 30 tablet 3   nitroGLYCERIN (NITRODUR - DOSED IN  MG/24 HR) 0.2 mg/hr patch Apply 1/4 of a patch to the affected area and change every 24 hours (re: tendinopathy) 30 patch 4   Semaglutide,0.25 or 0.5MG /DOS, (OZEMPIC, 0.25 OR 0.5 MG/DOSE,) 2 MG/1.5ML SOPN Inject 0.5 mg into the skin once a week. 4.5 mL 3   No facility-administered medications prior to visit.   Review of Systems  Review of Systems  Constitutional:  Positive for fatigue.  Respiratory: Negative.  Negative for cough and shortness of breath.   Psychiatric/Behavioral:  Positive for sleep disturbance.     Physical Exam  BP 110/80 (BP Location: Left Arm, Patient Position: Sitting, Cuff Size: Normal)   Pulse 81   Temp (!) 97.5 F (36.4 C) (Oral)   Ht 5\' 7"  (1.702 m)    Wt 249 lb (112.9 kg)   SpO2 96%   BMI 39.00 kg/m  Physical Exam Constitutional:      Appearance: Normal appearance.  HENT:     Head: Normocephalic and atraumatic.     Nose: Congestion present.     Mouth/Throat:     Mouth: Mucous membranes are moist.     Pharynx: Oropharynx is clear.     Comments: Mallampati class I Cardiovascular:     Rate and Rhythm: Normal rate and regular rhythm.  Pulmonary:     Effort: Pulmonary effort is normal.     Breath sounds: Normal breath sounds.  Musculoskeletal:        General: Normal range of motion.     Cervical back: Normal range of motion and neck supple.  Skin:    General: Skin is warm and dry.  Neurological:     General: No focal deficit present.     Mental Status: She is alert and oriented to person, place, and time. Mental status is at baseline.  Psychiatric:        Mood and Affect: Mood normal.        Behavior: Behavior normal.        Thought Content: Thought content normal.        Judgment: Judgment normal.     Lab Results:  CBC    Component Value Date/Time   WBC 7.9 03/03/2021 0741   RBC 4.52 03/03/2021 0741   HGB 14.4 03/03/2021 0741   HGB 12.9 08/03/2012 0403   HCT 42.6 03/03/2021 0741   HCT 38.2 08/03/2012 0403   PLT 266.0 03/03/2021 0741   PLT 252 08/03/2012 0403   MCV 94.1 03/03/2021 0741   MCV 93 08/03/2012 0403   MCH 31.5 08/03/2012 0403   MCHC 33.8 03/03/2021 0741   RDW 12.8 03/03/2021 0741   RDW 12.9 08/03/2012 0403   LYMPHSABS 3.9 03/03/2021 0741   LYMPHSABS 2.0 08/03/2012 0403   MONOABS 0.6 03/03/2021 0741   MONOABS 0.8 08/03/2012 0403   EOSABS 0.4 03/03/2021 0741   EOSABS 0.0 08/03/2012 0403   BASOSABS 0.0 03/03/2021 0741   BASOSABS 0.0 08/03/2012 0403    BMET    Component Value Date/Time   NA 139 09/05/2021 0851   NA 139 08/03/2012 0403   K 4.2 09/05/2021 0851   K 5.0 08/03/2012 0403   CL 103 09/05/2021 0851   CL 105 08/03/2012 0403   CO2 28 09/05/2021 0851   CO2 29 08/03/2012 0403    GLUCOSE 108 (H) 09/05/2021 0851   GLUCOSE 115 (H) 08/03/2012 0403   BUN 17 09/05/2021 0851   BUN 9 08/03/2012 0403   CREATININE 1.06 09/05/2021 0851   CREATININE 1.04 08/03/2012 0403  CALCIUM 9.6 09/05/2021 0851   CALCIUM 8.5 08/03/2012 0403   GFRNONAA >60 08/03/2012 0403   GFRAA >60 08/03/2012 0403    BNP No results found for: BNP  ProBNP No results found for: PROBNP  Imaging: No results found.   Assessment & Plan:   Obstructive sleep apnea of adult - Hx sleep apnea dx 2009, she has not used CPAP in > 10 years. She has symptoms of loud snoring, apnea, restless sleep and daytime sleepiness. Epworth score 15. Needs home sleep test to re-evaluate for OSA. We discussed how untreated sleep apnea can increased patient risk for cardiac arrhthymias, pulm HTN, stroke and DM. Treatment options briefly reviewed including weight loss, oral appliance, CPAP therapy or referral to ENT. Advised patient not drive if experiencing excessive daytime sleepiness. Follow-up in 4-6 weeks after sleep study.   Rhinitis - Patient has some upper airway congestion. Recommend she try Flonase nasal spray daily and prn saline nasal rinses 1-2 times a day as needed.    Glenford Bayley, NP 10/15/2021

## 2021-10-15 ENCOUNTER — Ambulatory Visit (INDEPENDENT_AMBULATORY_CARE_PROVIDER_SITE_OTHER): Payer: 59 | Admitting: Primary Care

## 2021-10-15 ENCOUNTER — Encounter: Payer: Self-pay | Admitting: Primary Care

## 2021-10-15 ENCOUNTER — Other Ambulatory Visit: Payer: Self-pay

## 2021-10-15 VITALS — BP 110/80 | HR 81 | Temp 97.5°F | Ht 67.0 in | Wt 249.0 lb

## 2021-10-15 DIAGNOSIS — Z8669 Personal history of other diseases of the nervous system and sense organs: Secondary | ICD-10-CM

## 2021-10-15 DIAGNOSIS — R0683 Snoring: Secondary | ICD-10-CM

## 2021-10-15 DIAGNOSIS — G4733 Obstructive sleep apnea (adult) (pediatric): Secondary | ICD-10-CM | POA: Diagnosis not present

## 2021-10-15 DIAGNOSIS — J310 Chronic rhinitis: Secondary | ICD-10-CM | POA: Diagnosis not present

## 2021-10-15 NOTE — Patient Instructions (Addendum)
Untreated sleep apnea puts you at higher risk for cardiac arrhthymias, stroke, pulmonary HTN, diabetes  Treatment options are weight loss, oral appliance, CPAP or referral to ENT  Orders: Home sleep test (through sleep lab)  Follow-up: 4-6 weeks to review sleep study results and treatment options further    Sleep Apnea Sleep apnea affects breathing during sleep. It causes breathing to stop for 10 seconds or more, or to become shallow. People with sleep apnea usually snore loudly. It can also increase the risk of: Heart attack. Stroke. Being very overweight (obese). Diabetes. Heart failure. Irregular heartbeat. High blood pressure. The goal of treatment is to help you breathe normally again. What are the causes? The most common cause of this condition is a collapsed or blocked airway. There are three kinds of sleep apnea: Obstructive sleep apnea. This is caused by a blocked or collapsed airway. Central sleep apnea. This happens when the brain does not send the right signals to the muscles that control breathing. Mixed sleep apnea. This is a combination of obstructive and central sleep apnea. What increases the risk? Being overweight. Smoking. Having a small airway. Being older. Being female. Drinking alcohol. Taking medicines to calm yourself (sedatives or tranquilizers). Having family members with the condition. Having a tongue or tonsils that are larger than normal. What are the signs or symptoms? Trouble staying asleep. Loud snoring. Headaches in the morning. Waking up gasping. Dry mouth or sore throat in the morning. Being sleepy or tired during the day. If you are sleepy or tired during the day, you may also: Not be able to focus your mind (concentrate). Forget things. Get angry a lot and have mood swings. Feel sad (depressed). Have changes in your personality. Have less interest in sex, if you are female. Be unable to have an erection, if you are female. How is  this treated?  Sleeping on your side. Using a medicine to get rid of mucus in your nose (decongestant). Avoiding the use of alcohol, medicines to help you relax, or certain pain medicines (narcotics). Losing weight, if needed. Changing your diet. Quitting smoking. Using a machine to open your airway while you sleep, such as: An oral appliance. This is a mouthpiece that shifts your lower jaw forward. A CPAP device. This device blows air through a mask when you breathe out (exhale). An EPAP device. This has valves that you put in each nostril. A BIPAP device. This device blows air through a mask when you breathe in (inhale) and breathe out. Having surgery if other treatments do not work. Follow these instructions at home: Lifestyle Make changes that your doctor recommends. Eat a healthy diet. Lose weight if needed. Avoid alcohol, medicines to help you relax, and some pain medicines. Do not smoke or use any products that contain nicotine or tobacco. If you need help quitting, ask your doctor. General instructions Take over-the-counter and prescription medicines only as told by your doctor. If you were given a machine to use while you sleep, use it only as told by your doctor. If you are having surgery, make sure to tell your doctor you have sleep apnea. You may need to bring your device with you. Keep all follow-up visits. Contact a doctor if: The machine that you were given to use during sleep bothers you or does not seem to be working. You do not get better. You get worse. Get help right away if: Your chest hurts. You have trouble breathing in enough air. You have an uncomfortable feeling  in your back, arms, or stomach. You have trouble talking. One side of your body feels weak. A part of your face is hanging down. These symptoms may be an emergency. Get help right away. Call your local emergency services (911 in the [place] Do not wait to see if the symptoms will go away. Do not  drive yourself to the hospital. Summary This condition affects breathing during sleep. The most common cause is a collapsed or blocked airway. The goal of treatment is to help you breathe normally while you sleep. This information is not intended to replace advice given to you by your health care provider. Make sure you discuss any questions you have with your health care provider. Document Revised: 06/25/2021 Document Reviewed: 10/25/2020 Elsevier Patient Education  2022 [employer].

## 2021-10-15 NOTE — Assessment & Plan Note (Addendum)
-   Hx sleep apnea dx 2009, she has not used CPAP in > 10 years. She has symptoms of loud snoring, apnea, restless sleep and daytime sleepiness. Epworth score 15. Needs home sleep test to re-evaluate for OSA. We discussed how untreated sleep apnea can increased patient risk for cardiac arrhthymias, pulm HTN, stroke and DM. Treatment options briefly reviewed including weight loss, oral appliance, CPAP therapy or referral to ENT. Advised patient not drive if experiencing excessive daytime sleepiness. Follow-up in 4-6 weeks after sleep study.

## 2021-10-15 NOTE — Progress Notes (Signed)
Reviewed and agree with assessment/plan.   Penny Arrambide, MD [facility] 10/15/2021, 10:23 AM Pager:  [phone]  

## 2021-10-15 NOTE — Assessment & Plan Note (Signed)
-   Patient has some upper airway congestion. Recommend she try Flonase nasal spray daily and prn saline nasal rinses 1-2 times a day as needed.

## 2021-11-04 ENCOUNTER — Ambulatory Visit: Payer: 59 | Attending: Otolaryngology

## 2021-11-04 DIAGNOSIS — R0683 Snoring: Secondary | ICD-10-CM | POA: Diagnosis present

## 2021-11-04 DIAGNOSIS — G4733 Obstructive sleep apnea (adult) (pediatric): Secondary | ICD-10-CM | POA: Insufficient documentation

## 2021-11-07 ENCOUNTER — Other Ambulatory Visit: Payer: Self-pay

## 2021-11-18 NOTE — Progress Notes (Signed)
Virtual Visit via Video Note  I connected with Rebekah Benton on 11/18/21 at 10:30 AM EST by a video enabled telemedicine application and verified that I am speaking with the correct person using two identifiers.  Location: Patient: Home Provider: Office    I discussed the limitations of evaluation and management by telemedicine and the availability of in person appointments. The patient expressed understanding and agreed to proceed.  History of Present Illness: 50 year old female, current every day smoker. PMH significant for OSA, HTN, GERD, type 2 diabetes.   Previous [facility] pulmonary encounter:  10/15/2021 Patient presents today for sleep consult. She was dx with sleep apnea in 2009. She has not used her CPAP machine in > 10 years. She reports symptoms of loud snoring, restless sleep and daytime fatigue/sleepiness. She feels tired all the time and can take a nap at any moment. She works from home. She has fallen asleep at her desk before and woken her self up snoring. Denies symptoms of narcolepsy, cataplexy or sleep walking.   Sleep questionnaire Prior sleep study- < 10 years ago (2009) Symptoms- Loud snoring, witneessed apnea, restless sleep, daytime sleepiness Bedtime- 10pm-12am Time to fall asleep- 1-2 hours Nocturnal awakenings- 3+ Out of bed in morning- 5-9am Weight changes- 10 lbs  Epworth score- 15  11/19/2021- Interim hx  Patient contacted today to review sleep study results. Patient has symptoms of loud snoring, witness apnea, restless sleep, daytime sleepiness. She has hx sleep apnea, stopped wearing CPAP > 10 years ago. HST on 11/06/21 showed severe obstructive sleep apnea, AHI 34.3/hr. We discussed treatment options including weight loss, oral appliance, CPAP therapy or referral to ENT for possible surgical options.  Due to severity of sleep apnea recommend patient be restarted on CPAP therapy, patient agreed with plan.   Observations/Objective:  -Appears well; patient  is alert and oriented x3.  No respiratory distress.   11/06/21 HST >> AHI 34.3/hr with SpO2 low 81% (basal 90-94%).   Assessment and Plan:  Severe OSA - Patient has symptoms or snoring, witnessed apnea, rest less sleep and daytime sleepiness - HST on 11/06/2021 showed severe obstructive sleep apnea, AHI 34/hr - Reviewed risks of untreated sleep apnea and treatment options, patient agreed to start CPAP therapy  - DME order placed for new CPAP start auto titrate 5-15cm h20 with mask of choice - Recommend patient aim to wear CPAP every night for 4 to 6 hours or longer Encouraged weight loss efforts and advised against driving if experiencing daytime sleepiness  Follow Up Instructions:  - Follow-up 31-90 days after starting CPAP therapy   I discussed the assessment and treatment plan with the patient. The patient was provided an opportunity to ask questions and all were answered. The patient agreed with the plan and demonstrated an understanding of the instructions.   The patient was advised to call back or seek an in-person evaluation if the symptoms worsen or if the condition fails to improve as anticipated.  I provided 22 minutes of non-face-to-face time during this encounter.   Glenford Bayley, NP

## 2021-11-19 ENCOUNTER — Telehealth (INDEPENDENT_AMBULATORY_CARE_PROVIDER_SITE_OTHER): Payer: 59 | Admitting: Primary Care

## 2021-11-19 ENCOUNTER — Telehealth: Payer: Self-pay | Admitting: Primary Care

## 2021-11-19 ENCOUNTER — Other Ambulatory Visit: Payer: Self-pay

## 2021-11-19 DIAGNOSIS — G4733 Obstructive sleep apnea (adult) (pediatric): Secondary | ICD-10-CM | POA: Diagnosis not present

## 2021-11-19 NOTE — Telephone Encounter (Signed)
The patient will need to check with the DME company.  The [facility] do not handle anything with billing for cpaps.

## 2021-11-19 NOTE — Telephone Encounter (Signed)
ATC patient per DPR left detailed message letting her know that she will have to reach out to DME company which is [employer] to ask them their questions as far as billing and when she is going to receive her machine. I have provided her the phone number for [employer] and advised her to call back with any other questions. Nothing further needed at this time.

## 2021-11-19 NOTE — Telephone Encounter (Signed)
Patient wanting to know if insurance will be billed for CPAP from date order was placed or when she receives machine. She was hoping to get it prior to the end of 2022. Any information you can provide patient will be appreciated. She lives in [place].

## 2021-11-19 NOTE — Patient Instructions (Signed)
Home sleep study showed severe obstructive sleep apnea, you had on average 34 events an hour  Recommendations: Aim to wear CPAP every night 4-6 hours or longer Do not drive if experiencing excessive daytime sleepiness Work on weight loss efforts   Orders: New CPAP start 5-15cm h20 (ordered)  Follow-up: Call to make follow-up 31-90 days after receiving CPAP (I will put a recall in 4 months from now just in case)     CPAP and BIPAP Information CPAP and BIPAP are methods that use air pressure to keep your airways open and to help you breathe well. CPAP and BIPAP use different amounts of pressure. Your health care provider will tell you whether CPAP or BIPAP would be more helpful for you. CPAP stands for "continuous positive airway pressure." With CPAP, the amount of pressure stays the same while you breathe in (inhale) and out (exhale). BIPAP stands for "bi-level positive airway pressure." With BIPAP, the amount of pressure will be higher when you inhale and lower when you exhale. This allows you to take larger breaths. CPAP or BIPAP may be used in the hospital, or your health care provider may want you to use it at home. You may need to have a sleep study before your health care provider can order a machine for you to use at home. What are the advantages? CPAP or BIPAP can be helpful if you have: Sleep apnea. Chronic obstructive pulmonary disease (COPD). Heart failure. Medical conditions that cause muscle weakness, including muscular dystrophy or amyotrophic lateral sclerosis (ALS). Other problems that cause breathing to be shallow, weak, abnormal, or difficult. CPAP and BIPAP are most commonly used for obstructive sleep apnea (OSA) to keep the airways from collapsing when the muscles relax during sleep. What are the risks? Generally, this is a safe treatment. However, problems may occur, including: Irritated skin or skin sores if the mask does not fit properly. Dry or stuffy nose or  nosebleeds. Dry mouth. Feeling gassy or bloated. Sinus or lung infection if the equipment is not cleaned properly. When should CPAP or BIPAP be used? In most cases, the mask only needs to be worn during sleep. Generally, the mask needs to be worn throughout the night and during any daytime naps. People with certain medical conditions may also need to wear the mask at other times, such as when they are awake. Follow instructions from your health care provider about when to use the machine. What happens during CPAP or BIPAP? Both CPAP and BIPAP are provided by a small machine with a flexible plastic tube that attaches to a plastic mask that you wear. Air is blown through the mask into your nose or mouth. The amount of pressure that is used to blow the air can be adjusted on the machine. Your health care provider will set the pressure setting and help you find the best mask for you. Tips for using the mask Because the mask needs to be snug, some people feel trapped or closed-in (claustrophobic) when first using the mask. If you feel this way, you may need to get used to the mask. One way to do this is to hold the mask loosely over your nose or mouth and then gradually apply the mask more snugly. You can also gradually increase the amount of time that you use the mask. Masks are available in various types and sizes. If your mask does not fit well, talk with your health care provider about getting a different one. Some common types of  masks include: Full face masks, which fit over the mouth and nose. Nasal masks, which fit over the nose. Nasal pillow or prong masks, which fit into the nostrils. If you are using a mask that fits over your nose and you tend to breathe through your mouth, a chin strap may be applied to help keep your mouth closed. Use a skin barrier to protect your skin as told by your health care provider. Some CPAP and BIPAP machines have alarms that may sound if the mask comes off or  develops a leak. If you have trouble with the mask, it is very important that you talk with your health care provider about finding a way to make the mask easier to tolerate. Do not stop using the mask. There could be a negative impact on your health if you stop using the mask. Tips for using the machine Place your CPAP or BIPAP machine on a secure table or stand near an electrical outlet. Know where the on/off switch is on the machine. Follow instructions from your health care provider about how to set the pressure on your machine and when you should use it. Do not eat or drink while the CPAP or BIPAP machine is on. Food or fluids could get pushed into your lungs by the pressure of the CPAP or BIPAP. For home use, CPAP and BIPAP machines can be rented or purchased through home health care companies. Many different brands of machines are available. Renting a machine before purchasing may help you find out which particular machine works well for you. Your health insurance company may also decide which machine you may get. Keep the CPAP or BIPAP machine and attachments clean. Ask your health care provider for specific instructions. Check the humidifier if you have a dry stuffy nose or nosebleeds. Make sure it is working correctly. Follow these instructions at home: Take over-the-counter and prescription medicines only as told by your health care provider. Ask if you can take sinus medicine if your sinuses are blocked. Do not use any products that contain nicotine or tobacco. These products include cigarettes, chewing tobacco, and vaping devices, such as e-cigarettes. If you need help quitting, ask your health care provider. Keep all follow-up visits. This is important. Contact a health care provider if: You have redness or pressure sores on your head, face, mouth, or nose from the mask or head gear. You have trouble using the CPAP or BIPAP machine. You cannot tolerate wearing the CPAP or BIPAP  mask. Someone tells you that you snore even when wearing your CPAP or BIPAP. Get help right away if: You have trouble breathing. You feel confused. Summary CPAP and BIPAP are methods that use air pressure to keep your airways open and to help you breathe well. If you have trouble with the mask, it is very important that you talk with your health care provider about finding a way to make the mask easier to tolerate. Do not stop using the mask. There could be a negative impact to your health if you stop using the mask. Follow instructions from your health care provider about when to use the machine. This information is not intended to replace advice given to you by your health care provider. Make sure you discuss any questions you have with your health care provider. Document Revised: 06/25/2021 Document Reviewed: 10/25/2020 Elsevier Patient Education  2022 [employer].

## 2021-11-19 NOTE — Progress Notes (Signed)
Reviewed and agree with assessment/plan.   Coralyn Helling, MD [facility] Pulmonary/Critical Care 11/19/2021, 1:48 PM Pager:  [phone]

## 2022-02-09 ENCOUNTER — Other Ambulatory Visit: Payer: Self-pay | Admitting: Family Medicine

## 2022-02-09 DIAGNOSIS — Z1231 Encounter for screening mammogram for malignant neoplasm of breast: Secondary | ICD-10-CM

## 2022-03-03 ENCOUNTER — Ambulatory Visit
Admission: RE | Admit: 2022-03-03 | Discharge: 2022-03-03 | Disposition: A | Discharge status: Home / Self Care | Payer: 59 | Source: Ambulatory Visit | Attending: Family Medicine | Admitting: Family Medicine

## 2022-03-03 ENCOUNTER — Telehealth: Payer: Self-pay | Admitting: Family Medicine

## 2022-03-03 DIAGNOSIS — E119 Type 2 diabetes mellitus without complications: Secondary | ICD-10-CM

## 2022-03-03 DIAGNOSIS — Z1231 Encounter for screening mammogram for malignant neoplasm of breast: Secondary | ICD-10-CM | POA: Diagnosis present

## 2022-03-03 DIAGNOSIS — E7800 Pure hypercholesterolemia, unspecified: Secondary | ICD-10-CM

## 2022-03-03 DIAGNOSIS — I10 Essential (primary) hypertension: Secondary | ICD-10-CM

## 2022-03-03 NOTE — Telephone Encounter (Signed)
-----   Message from Velna Hatchet, RT sent at 02/18/2022  4:29 PM EDT ----- ?Regarding: Lab order for Wednesday, 03/04/22 appt ?Patient is scheduled for cpx, please order future labs.  Thanks, Anda Kraft  ? ?

## 2022-03-04 ENCOUNTER — Other Ambulatory Visit (INDEPENDENT_AMBULATORY_CARE_PROVIDER_SITE_OTHER): Payer: 59

## 2022-03-04 DIAGNOSIS — I10 Essential (primary) hypertension: Secondary | ICD-10-CM

## 2022-03-04 DIAGNOSIS — E119 Type 2 diabetes mellitus without complications: Secondary | ICD-10-CM

## 2022-03-04 DIAGNOSIS — E7800 Pure hypercholesterolemia, unspecified: Secondary | ICD-10-CM | POA: Diagnosis not present

## 2022-03-04 LAB — LIPID PANEL
Cholesterol: 160 mg/dL (ref 0–200)
HDL: 53.9 mg/dL (ref >39.00)
LDL Cholesterol: 90 mg/dL (ref 0–99)
NonHDL: 106.39
Total CHOL/HDL Ratio: 3
Triglycerides: 83 mg/dL (ref 0.0–149.0)
VLDL: 16.6 mg/dL (ref 0.0–40.0)

## 2022-03-04 LAB — CBC WITH DIFFERENTIAL/PLATELET
Basophils Absolute: 0.1 10*3/uL (ref 0.0–0.1)
Basophils Relative: 0.7 % (ref 0.0–3.0)
Eosinophils Absolute: 0.1 10*3/uL (ref 0.0–0.7)
Eosinophils Relative: 1.5 % (ref 0.0–5.0)
HCT: 42.9 % (ref 36.0–46.0)
Hemoglobin: 14.1 g/dL (ref 12.0–15.0)
Lymphocytes Relative: 47.7 % — ABNORMAL HIGH (ref 12.0–46.0)
Lymphs Abs: 3.6 10*3/uL (ref 0.7–4.0)
MCHC: 33 g/dL (ref 30.0–36.0)
MCV: 94.7 fl (ref 78.0–100.0)
Monocytes Absolute: 0.6 10*3/uL (ref 0.1–1.0)
Monocytes Relative: 7.3 % (ref 3.0–12.0)
Neutro Abs: 3.2 10*3/uL (ref 1.4–7.7)
Neutrophils Relative %: 42.8 % — ABNORMAL LOW (ref 43.0–77.0)
Platelets: 281 10*3/uL (ref 150.0–400.0)
RBC: 4.53 Mil/uL (ref 3.87–5.11)
RDW: 12.9 % (ref 11.5–15.5)
WBC: 7.6 10*3/uL (ref 4.0–10.5)

## 2022-03-04 LAB — COMPREHENSIVE METABOLIC PANEL
ALT: 27 U/L (ref 0–35)
AST: 23 U/L (ref 0–37)
Albumin: 4.3 g/dL (ref 3.5–5.2)
Alkaline Phosphatase: 110 U/L (ref 39–117)
BUN: 15 mg/dL (ref 6–23)
CO2: 28 mEq/L (ref 19–32)
Calcium: 9.6 mg/dL (ref 8.4–10.5)
Chloride: 105 mEq/L (ref 96–112)
Creatinine, Ser: 0.97 mg/dL (ref 0.40–1.20)
GFR: 68.2 mL/min (ref >60.00)
Glucose, Bld: 110 mg/dL — ABNORMAL HIGH (ref 70–99)
Potassium: 4.7 mEq/L (ref 3.5–5.1)
Sodium: 140 mEq/L (ref 135–145)
Total Bilirubin: 0.5 mg/dL (ref 0.2–1.2)
Total Protein: 6.5 g/dL (ref 6.0–8.3)

## 2022-03-04 LAB — TSH: TSH: 1.6 u[IU]/mL (ref 0.35–5.50)

## 2022-03-04 LAB — HEMOGLOBIN A1C: Hgb A1c MFr Bld: 6.4 % (ref 4.6–6.5)

## 2022-03-10 ENCOUNTER — Encounter: Payer: 59 | Admitting: Family Medicine

## 2022-03-20 ENCOUNTER — Other Ambulatory Visit: Payer: Self-pay | Admitting: Family Medicine

## 2022-03-24 ENCOUNTER — Ambulatory Visit (INDEPENDENT_AMBULATORY_CARE_PROVIDER_SITE_OTHER): Payer: 59 | Admitting: Family Medicine

## 2022-03-24 ENCOUNTER — Encounter: Payer: Self-pay | Admitting: Family Medicine

## 2022-03-24 VITALS — BP 126/82 | HR 89 | Temp 97.8°F | Ht 66.75 in | Wt 251.5 lb

## 2022-03-24 DIAGNOSIS — Z1211 Encounter for screening for malignant neoplasm of colon: Secondary | ICD-10-CM

## 2022-03-24 DIAGNOSIS — E119 Type 2 diabetes mellitus without complications: Secondary | ICD-10-CM | POA: Diagnosis not present

## 2022-03-24 DIAGNOSIS — K219 Gastro-esophageal reflux disease without esophagitis: Secondary | ICD-10-CM | POA: Diagnosis not present

## 2022-03-24 DIAGNOSIS — IMO0001 Reserved for inherently not codable concepts without codable children: Secondary | ICD-10-CM

## 2022-03-24 DIAGNOSIS — Z0000 Encounter for general adult medical examination without abnormal findings: Secondary | ICD-10-CM

## 2022-03-24 DIAGNOSIS — I10 Essential (primary) hypertension: Secondary | ICD-10-CM | POA: Diagnosis not present

## 2022-03-24 DIAGNOSIS — F17200 Nicotine dependence, unspecified, uncomplicated: Secondary | ICD-10-CM

## 2022-03-24 DIAGNOSIS — E7800 Pure hypercholesterolemia, unspecified: Secondary | ICD-10-CM

## 2022-03-24 DIAGNOSIS — F418 Other specified anxiety disorders: Secondary | ICD-10-CM

## 2022-03-24 DIAGNOSIS — G4733 Obstructive sleep apnea (adult) (pediatric): Secondary | ICD-10-CM

## 2022-03-24 MED ORDER — ESCITALOPRAM OXALATE 20 MG PO TABS: 20.0000 mg | ORAL_TABLET | Freq: Every day | ORAL | 3 refills | Status: DC

## 2022-03-24 MED ORDER — CLONAZEPAM 1 MG PO TABS: 1.0000 mg | ORAL_TABLET | Freq: Two times a day (BID) | ORAL | 0 refills | Status: DC | PRN

## 2022-03-24 MED ORDER — ROSUVASTATIN CALCIUM 10 MG PO TABS: 10.0000 mg | ORAL_TABLET | Freq: Every day | ORAL | 3 refills | Status: DC

## 2022-03-24 MED ORDER — LISINOPRIL 10 MG PO TABS: 10.0000 mg | ORAL_TABLET | Freq: Every day | ORAL | 3 refills | Status: DC

## 2022-03-24 MED ORDER — METOPROLOL TARTRATE 50 MG PO TABS: 50.0000 mg | ORAL_TABLET | Freq: Every day | ORAL | 3 refills | Status: DC

## 2022-03-24 NOTE — Assessment & Plan Note (Signed)
Lab Results  ?Component Value Date  ? HGBA1C 6.4 03/04/2022  ? ?Continues to improve with diet  ?Not motivated for exercise ?No statin and ace ?Could not afford to continue semaglutide (ins not covering)  ?Declines metformin  ?Plans to schedule eye exam ?Good foot care  ?

## 2022-03-24 NOTE — Assessment & Plan Note (Signed)
Pt uses cpap ?Still struggles getting more than 4 h sleep per night ?

## 2022-03-24 NOTE — Assessment & Plan Note (Signed)
Declines colonoscopy

## 2022-03-24 NOTE — Assessment & Plan Note (Addendum)
Still struggling  ?Disliked wellbutrin  ?? If lexapro is working but hesitant to change (discussed SNRI) ?Uses klonopin sparingly (60 pills of 1 mg per year) ?Reviewed stressors/ coping techniques/symptoms/ support sources/ tx options and side effects in detail today  ?Lacks motivation/more and more anxious but no SI ?Given # for the [facility] to call and inquire about counseling and psychiatry care  ?

## 2022-03-24 NOTE — Progress Notes (Signed)
? ?Subjective:  ? ? Patient ID: Rebekah Benton, female    DOB: 29-Mar-1971, 51 y.o.   MRN: [id number] ? ?HPI ?Here for health maintenance exam and to review chronic medical problems   ? ?Wt Readings from Last 3 Encounters:  ?03/24/22 251 lb 8 oz (114.1 kg)  ?10/15/21 249 lb (112.9 kg)  ?05/09/21 254 lb 1 oz (115.2 kg)  ? ?39.69 kg/m? ? ?Feeling ok/not great  ?Fair self care  ? ? ?Immunization History  ?Administered Date(s) Administered  ? PFIZER(Purple Top)SARS-COV-2 Vaccination 05/17/2020, 06/22/2020  ? Tdap 02/26/2014  ? ?Colon cancer screening  ? ?Zoster status : not interested in shingrix  ?Declines flu shots  ? ?Mammogram 02/2022  ?Self breast exam: no lumps  ? ?Hysterectomy in the past  :no gyn problems (fully menopausal) used some herbs instead of hrt  ?No hot flashes / that was 20 y ago  ?Bone health  : no supplements  ? ?No falls or fractures  ? ?OSA- uses cpap  ?Does not see much of a difference so far  ?Could not get over 4 hours of sleep  ? ? ?Smoking status : 1/2 ppd or less  ?Has thought about quitting, is gradually cutting back  ?Has gone through lunch w/o smoking  ?Had only 2 cig on Sunday  ? ? ?HTN ?bp is stable today  ?No cp or palpitations or headaches or edema  ?No side effects to medicines  ?BP Readings from Last 3 Encounters:  ?03/24/22 126/82  ?10/15/21 110/80  ?05/09/21 128/76  ?   ?Lisinopril 10 mg daily  ?Metoprolol 50 mg daily  ? ?Pulse Readings from Last 3 Encounters:  ?03/24/22 89  ?10/15/21 81  ?05/09/21 90  ? ?GERD: generic nexium 20 mg daily  ?H/o bariatric surgery ? ?DM2. ?Lab Results  ?Component Value Date  ? HGBA1C 6.4 03/04/2022  ? ?This is down from 6.7 ?Declines medication  ?Has been trying to watch her diet but has not lost weight yet  ? ?We tried ozempic - used coupon and after a year coverage stopped  ?Ins does not cover  ?Not ready for metformin  ? ? ?Making effort not to stress eat  ?Not a lot of sugar  ?Likes good food  ?Does eat too many carbs  ?Changed to brown rice     ? ?Not much exercise  ?Wants to eventually but motivation is not good  ? ? ?On ace and statin  ?Eye exam: has not had  ?Foot care  ? ?Hyperlipidemia ?Lab Results  ?Component Value Date  ? CHOL 160 03/04/2022  ? CHOL 161 09/05/2021  ? CHOL 172 03/03/2021  ? ?Lab Results  ?Component Value Date  ? HDL 53.90 03/04/2022  ? HDL 47.30 09/05/2021  ? HDL 57.20 03/03/2021  ? ?Lab Results  ?Component Value Date  ? LDLCALC 90 03/04/2022  ? LDLCALC 81 09/05/2021  ? LDLCALC 85 03/03/2021  ? ?Lab Results  ?Component Value Date  ? TRIG 83.0 03/04/2022  ? TRIG 165.0 (H) 09/05/2021  ? TRIG 150.0 (H) 03/03/2021  ? ?Lab Results  ?Component Value Date  ? CHOLHDL 3 03/04/2022  ? CHOLHDL 3 09/05/2021  ? CHOLHDL 3 03/03/2021  ? ?No results found for: LDLDIRECT ?Crestor 10 mg daily  ? ?Mood/ hist of depression and anxiety  ?Lexapro 20 mg daily  ?Tried wellburrin - had side effects /the pamphlet scared her  ? ?Uses klonopin once a year - is very careful  ? ?Lab Results  ?Component Value Date  ?  CREATININE 0.97 03/04/2022  ? BUN 15 03/04/2022  ? NA 140 03/04/2022  ? K 4.7 03/04/2022  ? CL 105 03/04/2022  ? CO2 28 03/04/2022  ? ?Lab Results  ?Component Value Date  ? ALT 27 03/04/2022  ? AST 23 03/04/2022  ? ALKPHOS 110 03/04/2022  ? BILITOT 0.5 03/04/2022  ? ?Lab Results  ?Component Value Date  ? TSH 1.60 03/04/2022  ? ?Lab Results  ?Component Value Date  ? WBC 7.6 03/04/2022  ? HGB 14.1 03/04/2022  ? HCT 42.9 03/04/2022  ? MCV 94.7 03/04/2022  ? PLT 281.0 03/04/2022  ? ? ?Patient Active Problem List  ? Diagnosis Date Noted  ? Rhinitis 10/15/2021  ? Cellulitis and abscess of trunk 05/09/2021  ? Colon cancer screening 03/06/2021  ? Obstructive sleep apnea of adult 12/09/2017  ? Encounter for screening mammogram for breast cancer 09/15/2017  ? Morbid obesity (HCC) 07/19/2016  ? Sleep disorder 03/31/2016  ? Routine general medical examination at a health care facility 02/14/2015  ? Depression with anxiety 02/26/2014  ? Type 2 diabetes mellitus  without complications (HCC) 02/26/2014  ? Essential hypertension, benign 02/26/2014  ? Hyperlipidemia 02/26/2014  ? Smoking 02/26/2014  ? GERD (gastroesophageal reflux disease) 02/26/2014  ? ?Past Medical History:  ?Diagnosis Date  ? Depression   ? Diabetes mellitus without complication (HCC)   ? GERD (gastroesophageal reflux disease)   ? Hyperlipidemia   ? Hypertension   ? ?Past Surgical History:  ?Procedure Laterality Date  ? ABDOMINAL HYSTERECTOMY  2004  ? CHOLECYSTECTOMY  2009  ? SLEEVE GASTROPLASTY  08/02/2012  ? ?Social History  ? ?Tobacco Use  ? Smoking status: Every Day  ?  Packs/day: 0.50  ?  Types: Cigarettes  ? Smokeless tobacco: Never  ? Tobacco comments:  ?  Pt smoke less than  half a pack a day.  ?Substance Use Topics  ? Alcohol use: No  ?  Alcohol/week: 0.0 standard drinks  ? Drug use: No  ? ?Family History  ?Problem Relation Age of Onset  ? Cancer Father   ?     Prostate  ? Diabetes Father   ? Breast cancer Neg Hx   ? ?Allergies  ?Allergen Reactions  ? Atorvastatin   ?  GI side effects with the 80 mg   ? Ampicillin Rash  ? Penicillins Rash  ? ?Current Outpatient Medications on File Prior to Visit  ?Medication Sig Dispense Refill  ? Continuous Blood Gluc Receiver (FREESTYLE LIBRE 2 READER) DEVI Use as directed to monitor glucose for DM type 2 (dx. E11.9) 1 each 0  ? Continuous Blood Gluc Sensor (FREESTYLE LIBRE 2 SENSOR) MISC USE AS DIRECTED TO MONITOR GLUCOSE FOR TYPE 2 DM 6 each 0  ? esomeprazole (NEXIUM) 20 MG capsule Take 20 mg by mouth daily at 12 noon.    ? ?No current facility-administered medications on file prior to visit.  ?  ? ?Review of Systems  ?Constitutional:  Positive for fatigue. Negative for activity change, appetite change, fever and unexpected weight change.  ?HENT:  Negative for congestion, ear pain, rhinorrhea, sinus pressure and sore throat.   ?Eyes:  Negative for pain, redness and visual disturbance.  ?Respiratory:  Negative for cough, shortness of breath and wheezing.    ?Cardiovascular:  Negative for chest pain and palpitations.  ?Gastrointestinal:  Negative for abdominal pain, blood in stool, constipation and diarrhea.  ?Endocrine: Negative for polydipsia and polyuria.  ?Genitourinary:  Negative for dysuria, frequency and urgency.  ?Musculoskeletal:  Negative  for arthralgias, back pain and myalgias.  ?Skin:  Negative for pallor and rash.  ?Allergic/Immunologic: Negative for environmental allergies.  ?Neurological:  Negative for dizziness, syncope and headaches.  ?Hematological:  Negative for adenopathy. Does not bruise/bleed easily.  ?Psychiatric/Behavioral:  Positive for dysphoric mood. Negative for decreased concentration. The patient is nervous/anxious.   ? ?   ?Objective:  ? Physical Exam ?Constitutional:   ?   General: She is not in acute distress. ?   Appearance: Normal appearance. She is well-developed. She is obese. She is not ill-appearing or diaphoretic.  ?HENT:  ?   Head: Normocephalic and atraumatic.  ?   Right Ear: Tympanic membrane, ear canal and external ear normal.  ?   Left Ear: Tympanic membrane, ear canal and external ear normal.  ?   Nose: Nose normal. No congestion.  ?   Mouth/Throat:  ?   Mouth: Mucous membranes are moist.  ?   Pharynx: Oropharynx is clear. No posterior oropharyngeal erythema.  ?Eyes:  ?   General: No scleral icterus. ?   Extraocular Movements: Extraocular movements intact.  ?   Conjunctiva/sclera: Conjunctivae normal.  ?   Pupils: Pupils are equal, round, and reactive to light.  ?Neck:  ?   Thyroid: No thyromegaly.  ?   Vascular: No carotid bruit or JVD.  ?Cardiovascular:  ?   Rate and Rhythm: Normal rate and regular rhythm.  ?   Pulses: Normal pulses.  ?   Heart sounds: Normal heart sounds.  ?  No gallop.  ?Pulmonary:  ?   Effort: Pulmonary effort is normal. No respiratory distress.  ?   Breath sounds: Normal breath sounds. No wheezing.  ?   Comments: Good air exch ?Chest:  ?   Chest wall: No tenderness.  ?Abdominal:  ?   General: Bowel  sounds are normal. There is no distension or abdominal bruit.  ?   Palpations: Abdomen is soft. There is no mass.  ?   Tenderness: There is no abdominal tenderness.  ?   Hernia: No hernia is present.  ?Genitourina

## 2022-03-24 NOTE — Assessment & Plan Note (Signed)
Reviewed health habits including diet and exercise and skin cancer prevention ?Reviewed appropriate screening tests for age  ?Also reviewed health mt list, fam hx and immunization status , as well as social and family history   ?See HPI ?Labs reviewed  ?Declines colonoscopy  ?Interested in dexa if ins covers (she will see and let us know)  ?inst to start vit D 2000 iu daily , is high risk for OP with early menopause and ppi and smoking ?No falls or fx ?Disc smoking cessation/she continues to cut down  ?Mammogram utd ?Rev imms/ declines shingrix and flu shot  ? ?

## 2022-03-24 NOTE — Assessment & Plan Note (Signed)
Disc goals for lipids and reasons to control them ?Rev last labs with pt ?Rev low sat fat diet in detail ?Controlled with crestor 10 mg daily  ?

## 2022-03-24 NOTE — Assessment & Plan Note (Signed)
Disc in detail risks of smoking and possible outcomes including copd, vascular/ heart disease, cancer , respiratory and sinus infections  ?Pt voices understanding ?Has cut down  ?Some days just 2 cig  ?No resp symptoms  ?

## 2022-03-24 NOTE — Assessment & Plan Note (Signed)
bp in fair control at this time  ?BP Readings from Last 1 Encounters:  ?03/24/22 126/82  ? ?No changes needed ?Most recent labs reviewed  ?Disc lifstyle change with low sodium diet and exercise  ?Plan to continue  ?Lisinopril 10 mg daily  ?Metoprolol 50 mg daily ?

## 2022-03-24 NOTE — Assessment & Plan Note (Signed)
Discussed how this problem influences overall health and the risks it imposes  ?Reviewed plan for weight loss with lower calorie diet (via better food choices and also portion control or program like weight watchers) and exercise building up to or more than 30 minutes 5 days per week including some aerobic activity  ? ?Mental health barriers persist  ?Less emotional eating- commended  ?Not ready to exercise  ?Ins will no longer cover semaglutide  ?

## 2022-03-24 NOTE — Assessment & Plan Note (Signed)
Taking nexium 20 mg daily  ?Does well with this  ?Watches diet  ?

## 2022-03-24 NOTE — Patient Instructions (Addendum)
Aim for 2000 iu daily of vitamin D3 over the counter  ?If you can take calcium, that is good also  ? ?I would like to see if you insurance co covers a bone density  ?Let me know and I will order at the [facility] in [place]  ? ?Try to get most of your carbohydrates from produce (with the exception of white potatoes)  ?Eat less bread/pasta/rice/snack foods/cereals/sweets and other items from the middle of the grocery store (processed carbs) ? ?Think about some exercise when you are ready  ? ?Be sure go get your diabetic eye exam  ?Have them send [place] a copy  ? ?Reach out to the [facility] for counseling and psychiatric care  ?You can walk in or call ? ?[facility] center ?[address] [place], [place] [address]  ?[phone] ? ? ?

## 2022-03-30 ENCOUNTER — Other Ambulatory Visit: Payer: Self-pay | Admitting: Family Medicine

## 2022-03-30 NOTE — Telephone Encounter (Signed)
Is this okay to refill pt hasn't had this filled since 02/2021 ,please advise  ?

## 2022-04-03 ENCOUNTER — Encounter: Payer: Self-pay | Admitting: Family Medicine

## 2022-04-07 NOTE — Telephone Encounter (Signed)
Form in your inbox for review  

## 2022-04-12 ENCOUNTER — Other Ambulatory Visit: Payer: Self-pay | Admitting: Family Medicine

## 2022-05-07 ENCOUNTER — Other Ambulatory Visit: Payer: Self-pay | Admitting: Family Medicine

## 2022-05-07 ENCOUNTER — Encounter: Payer: Self-pay | Admitting: Family Medicine

## 2022-06-05 ENCOUNTER — Other Ambulatory Visit: Payer: Self-pay | Admitting: Family Medicine

## 2022-08-15 LAB — HM DIABETES EYE EXAM

## 2022-09-21 ENCOUNTER — Other Ambulatory Visit: Payer: Self-pay | Admitting: Family Medicine

## 2022-09-22 MED ORDER — CLONAZEPAM 1 MG PO TABS: 1.0000 mg | ORAL_TABLET | Freq: Two times a day (BID) | ORAL | 0 refills | Status: DC | PRN

## 2022-09-22 NOTE — Telephone Encounter (Signed)
Name of Medication: Embden Name of Pharmacy: [facility] or Written Date and Quantity: 03/24/22 #60 tab/ 0 refill Last Office Visit and Type: CPE on 03/24/22 Next Office Visit and Type: none scheduled

## 2022-09-22 NOTE — Telephone Encounter (Signed)
Name of Medication: Klonopin Name of Pharmacy: [facility] [address] Last Fill or Written Date and Quantity: 03/24/22 #60 tab/ 0 refill Last Office Visit and Type: CPE on 03/24/22 Next Office Visit and Type: none scheduled     

## 2023-02-16 ENCOUNTER — Telehealth: Payer: Self-pay | Admitting: Family Medicine

## 2023-02-16 DIAGNOSIS — I10 Essential (primary) hypertension: Secondary | ICD-10-CM

## 2023-02-16 DIAGNOSIS — E119 Type 2 diabetes mellitus without complications: Secondary | ICD-10-CM

## 2023-02-16 DIAGNOSIS — E7800 Pure hypercholesterolemia, unspecified: Secondary | ICD-10-CM

## 2023-02-16 NOTE — Telephone Encounter (Signed)
-----   Message from Katherine A Causey, RT sent at 02/01/2023  2:05 PM EST ----- Regarding: Wed 3/20 lab Patient is scheduled for cpx, please order future labs.  Thanks, Kate   

## 2023-02-17 ENCOUNTER — Other Ambulatory Visit (INDEPENDENT_AMBULATORY_CARE_PROVIDER_SITE_OTHER): Payer: 59

## 2023-02-17 DIAGNOSIS — E7800 Pure hypercholesterolemia, unspecified: Secondary | ICD-10-CM

## 2023-02-17 DIAGNOSIS — I10 Essential (primary) hypertension: Secondary | ICD-10-CM

## 2023-02-17 DIAGNOSIS — E119 Type 2 diabetes mellitus without complications: Secondary | ICD-10-CM

## 2023-02-17 LAB — LIPID PANEL
Cholesterol: 152 mg/dL (ref 0–200)
HDL: 51 mg/dL (ref >39.00)
LDL Cholesterol: 81 mg/dL (ref 0–99)
NonHDL: 100.88
Total CHOL/HDL Ratio: 3
Triglycerides: 100 mg/dL (ref 0.0–149.0)
VLDL: 20 mg/dL (ref 0.0–40.0)

## 2023-02-17 LAB — CBC WITH DIFFERENTIAL/PLATELET
Basophils Absolute: 0 10*3/uL (ref 0.0–0.1)
Basophils Relative: 0.4 % (ref 0.0–3.0)
Eosinophils Absolute: 0.1 10*3/uL (ref 0.0–0.7)
Eosinophils Relative: 1.3 % (ref 0.0–5.0)
HCT: 44.5 % (ref 36.0–46.0)
Hemoglobin: 14.8 g/dL (ref 12.0–15.0)
Lymphocytes Relative: 48.4 % — ABNORMAL HIGH (ref 12.0–46.0)
Lymphs Abs: 3.5 10*3/uL (ref 0.7–4.0)
MCHC: 33.2 g/dL (ref 30.0–36.0)
MCV: 93.1 fl (ref 78.0–100.0)
Monocytes Absolute: 0.6 10*3/uL (ref 0.1–1.0)
Monocytes Relative: 8.5 % (ref 3.0–12.0)
Neutro Abs: 3 10*3/uL (ref 1.4–7.7)
Neutrophils Relative %: 41.4 % — ABNORMAL LOW (ref 43.0–77.0)
Platelets: 247 10*3/uL (ref 150.0–400.0)
RBC: 4.78 Mil/uL (ref 3.87–5.11)
RDW: 13.2 % (ref 11.5–15.5)
WBC: 7.3 10*3/uL (ref 4.0–10.5)

## 2023-02-17 LAB — COMPREHENSIVE METABOLIC PANEL
ALT: 14 U/L (ref 0–35)
AST: 16 U/L (ref 0–37)
Albumin: 4.1 g/dL (ref 3.5–5.2)
Alkaline Phosphatase: 106 U/L (ref 39–117)
BUN: 14 mg/dL (ref 6–23)
CO2: 29 mEq/L (ref 19–32)
Calcium: 9.6 mg/dL (ref 8.4–10.5)
Chloride: 104 mEq/L (ref 96–112)
Creatinine, Ser: 0.92 mg/dL (ref 0.40–1.20)
GFR: 72.19 mL/min (ref >60.00)
Glucose, Bld: 120 mg/dL — ABNORMAL HIGH (ref 70–99)
Potassium: 4.4 mEq/L (ref 3.5–5.1)
Sodium: 141 mEq/L (ref 135–145)
Total Bilirubin: 0.5 mg/dL (ref 0.2–1.2)
Total Protein: 6.6 g/dL (ref 6.0–8.3)

## 2023-02-17 LAB — MICROALBUMIN / CREATININE URINE RATIO
Creatinine,U: 254.2 mg/dL
Microalb Creat Ratio: 0.7 mg/g (ref 0.0–30.0)
Microalb, Ur: 1.7 mg/dL (ref 0.0–1.9)

## 2023-02-17 LAB — TSH: TSH: 2.09 u[IU]/mL (ref 0.35–5.50)

## 2023-02-17 LAB — HEMOGLOBIN A1C: Hgb A1c MFr Bld: 6.8 % — ABNORMAL HIGH (ref 4.6–6.5)

## 2023-02-18 ENCOUNTER — Other Ambulatory Visit: Payer: Self-pay | Admitting: Family Medicine

## 2023-02-24 ENCOUNTER — Encounter: Payer: Self-pay | Admitting: Family Medicine

## 2023-02-24 ENCOUNTER — Ambulatory Visit (INDEPENDENT_AMBULATORY_CARE_PROVIDER_SITE_OTHER): Payer: 59 | Admitting: Family Medicine

## 2023-02-24 VITALS — BP 124/82 | HR 76 | Temp 97.9°F | Ht 66.5 in | Wt 257.2 lb

## 2023-02-24 DIAGNOSIS — Z1231 Encounter for screening mammogram for malignant neoplasm of breast: Secondary | ICD-10-CM | POA: Diagnosis not present

## 2023-02-24 DIAGNOSIS — I10 Essential (primary) hypertension: Secondary | ICD-10-CM

## 2023-02-24 DIAGNOSIS — Z0000 Encounter for general adult medical examination without abnormal findings: Secondary | ICD-10-CM | POA: Diagnosis not present

## 2023-02-24 DIAGNOSIS — Z1211 Encounter for screening for malignant neoplasm of colon: Secondary | ICD-10-CM | POA: Diagnosis not present

## 2023-02-24 DIAGNOSIS — G4733 Obstructive sleep apnea (adult) (pediatric): Secondary | ICD-10-CM

## 2023-02-24 DIAGNOSIS — K219 Gastro-esophageal reflux disease without esophagitis: Secondary | ICD-10-CM

## 2023-02-24 DIAGNOSIS — F17200 Nicotine dependence, unspecified, uncomplicated: Secondary | ICD-10-CM

## 2023-02-24 DIAGNOSIS — F418 Other specified anxiety disorders: Secondary | ICD-10-CM

## 2023-02-24 DIAGNOSIS — E785 Hyperlipidemia, unspecified: Secondary | ICD-10-CM

## 2023-02-24 DIAGNOSIS — E119 Type 2 diabetes mellitus without complications: Secondary | ICD-10-CM

## 2023-02-24 DIAGNOSIS — E1169 Type 2 diabetes mellitus with other specified complication: Secondary | ICD-10-CM

## 2023-02-24 MED ORDER — ESCITALOPRAM OXALATE 20 MG PO TABS: 20.0000 mg | ORAL_TABLET | Freq: Every day | ORAL | 3 refills | Status: DC

## 2023-02-24 MED ORDER — LISINOPRIL 10 MG PO TABS: 10.0000 mg | ORAL_TABLET | Freq: Every day | ORAL | 3 refills | Status: DC

## 2023-02-24 MED ORDER — ROSUVASTATIN CALCIUM 10 MG PO TABS: 10.0000 mg | ORAL_TABLET | Freq: Every day | ORAL | 3 refills | Status: DC

## 2023-02-24 NOTE — Assessment & Plan Note (Signed)
Disc in detail risks of smoking and possible outcomes including copd, vascular/ heart disease, cancer , respiratory and sinus infections  Pt voices understanding Cut down to 1/2 ppd or less  Not ready to quit Offered help when ready

## 2023-02-24 NOTE — Assessment & Plan Note (Signed)
Doing well with cpap.

## 2023-02-24 NOTE — Assessment & Plan Note (Signed)
Discussed how this problem influences overall health and the risks it imposes  Reviewed plan for weight loss with lower calorie diet (via better food choices and also portion control or program like weight watchers) and exercise building up to or more than 30 minutes 5 days per week including some aerobic activity    

## 2023-02-24 NOTE — Telephone Encounter (Signed)
In your inbox.

## 2023-02-24 NOTE — Patient Instructions (Addendum)
If you want to try some metofmirn for diabetes let us know   Keep thinking about quitting smoking   Follow up in 6 months for diabetes   Call for an appt with Dr Lorelei Pont to discuss knee and back when you are ready    Call and schedule your mammogram   You have an order for:  []   2D Mammogram  [x]   3D Mammogram  []   Bone Density     Please call for appointment:   [x]   [facility]  [place] [address]  [phone]  []   [place] at [facility])   [address] Room [place], [place] [address]  [phone]  []   [facility]      [address] [place], [place]         []   [facility]  [place], [place]  []  [facility]   [address], [place] [address]  [phone]  []  [facility]  [address] [place] [address] [place], [place] [address] [phone]    Make sure to wear two piece clothing  No lotions powders or deodorants the day of the appointment Make sure to bring picture ID and insurance card.  Bring list of medications you are currently taking including any supplements.   Schedule your screening mammogram through MyChart!   Select [place] imaging sites can now be scheduled through [place].  Log into your MyChart account.  Go to 'Visit' (or 'Appointments' if  on mobile App) --> Schedule an  Appointment  Under 'Select a Reason for Visit' choose the Mammogram  Screening option.  Complete the pre-visit questions  and select the time and place that  best fits your schedule

## 2023-02-24 NOTE — Assessment & Plan Note (Signed)
Reviewed health habits including diet and exercise and skin cancer prevention Reviewed appropriate screening tests for age  Also reviewed health mt list, fam hx and immunization status , as well as social and family history   See HPI Labs reviewed  Eye exam is up to date  Declines shingrix and flu shots Declines colon cancer screening  Mammogram ordered  Recommended smoking cessation /not ready yet Stable to improved mood with PHQ of 11

## 2023-02-24 NOTE — Progress Notes (Signed)
Subjective:    Patient ID: Rebekah Benton, female    DOB: Sep 12, 1971, 52 y.o.   MRN: [id number]  HPI Here for health maintenance exam and to review chronic medical problems    Wt Readings from Last 3 Encounters:  02/24/23 257 lb 4 oz (116.7 kg)  03/24/22 251 lb 8 oz (114.1 kg)  10/15/21 249 lb (112.9 kg)   40.90 kg/m  Feeling ok  Taking care of herself   Knee pain and low back pain are a problem   Walking- 50000 steps/3 mi per day  Making an effort    Immunization History  Administered Date(s) Administered   PFIZER(Purple Top)SARS-COV-2 Vaccination 05/17/2020, 06/22/2020   Tdap 02/26/2014   Health Maintenance Due  Topic Date Due   OPHTHALMOLOGY EXAM  Never done   HIV Screening  Never done   Hepatitis C Screening  Never done   Zoster Vaccines- Shingrix (1 of 2) Never done   INFLUENZA VACCINE  Never done   COVID-19 Vaccine (3 - 2023-24 season) 07/31/2022   Eye exam- had eye exam in the past year  Did not dilate eyes but they did take a picture  In the fall   Shingrix-declines  Flu shot - declines   Colon cancer screening - not interested in any yet     Mammogram 02/2022  Self breast exam- no lumps   Has had a hysterectomy   Also wt loss surgery- sleeve gastroplasty in 2013  Smoking status : 1/4 to 1/2 ppd  Has cut down some  Wants to quit but not quite ready yet   Mood  Doing ok overall/ a little better  Stress is always there     02/24/2023    8:10 AM 03/24/2022   10:11 AM 03/06/2021    9:08 AM 01/09/2020    9:42 AM 10/17/2018    4:00 PM  Depression screen PHQ 2/9  Decreased Interest 1 3 3 1 1   Down, Depressed, Hopeless 1 3 3  1   PHQ - 2 Score 2 6 6 1 2   Altered sleeping 3 3 3 3 2   Tired, decreased energy 2 3 3 3 1   Change in appetite 3 3 3 2 1   Feeling bad or failure about yourself  0 3 3 0 1  Trouble concentrating 1 3 3 2 2   Moving slowly or fidgety/restless 0 0 2 0 0  Suicidal thoughts 0 0 0 0 0  PHQ-9 Score 11 21 23 11 9   Difficult  doing work/chores Somewhat difficult  Very difficult      Takes lexapro 20 mg daily  Refill of klonopin in the fall     HTN bp is stable today  No cp or palpitations or headaches or edema  No side effects to medicines  BP Readings from Last 3 Encounters:  02/24/23 124/82  03/24/22 126/82  10/15/21 110/80     Lisinopril 10 mg daily  Metoprolol 50 mg daily   Pulse Readings from Last 3 Encounters:  02/24/23 76  03/24/22 89  10/15/21 81    OSA  using cpap   GERD Nexium 20 mg  Watching what she eats    DM2 Lab Results  Component Value Date   HGBA1C 6.8 (H) 02/17/2023   Was 6.4 a year ago  Declined medication so far   Was out of control with eating for a while - especially when working  Then changed teams, less stress and boredom Works from home Tries not to buy junk  Craving issues   Does struggle with sugar and carbs  Wears a CGM - notes what raises her sugar  Tries to eat more protein and less carbs  Choose more low carb opt  Good fruit and veggies  Lab Results  Component Value Date   MICROALBUR 1.7 02/17/2023           Hyperlipidemia Lab Results  Component Value Date   CHOL 152 02/17/2023   CHOL 160 03/04/2022   CHOL 161 09/05/2021   Lab Results  Component Value Date   HDL 51.00 02/17/2023   HDL 53.90 03/04/2022   HDL 47.30 09/05/2021   Lab Results  Component Value Date   LDLCALC 81 02/17/2023   [place] 90 03/04/2022   LDLCALC 81 09/05/2021   Lab Results  Component Value Date   TRIG 100.0 02/17/2023   TRIG 83.0 03/04/2022   TRIG 165.0 (H) 09/05/2021   Lab Results  Component Value Date   CHOLHDL 3 02/17/2023   CHOLHDL 3 03/04/2022   CHOLHDL 3 09/05/2021   No results found for: "LDLDIRECT"  Crestor 10 mg daily   Not a lot of fatty foods    Lab Results  Component Value Date   WBC 7.3 02/17/2023   HGB 14.8 02/17/2023   HCT 44.5 02/17/2023   MCV 93.1 02/17/2023   PLT 247.0 [id number]   Last metabolic panel Lab  Results  Component Value Date   GLUCOSE 120 (H) 02/17/2023   NA 141 02/17/2023   K 4.4 02/17/2023   CL 104 02/17/2023   CO2 29 02/17/2023   BUN 14 02/17/2023   CREATININE 0.92 02/17/2023   CALCIUM 9.6 02/17/2023   PHOS 4.3 08/03/2012   PROT 6.6 02/17/2023   ALBUMIN 4.1 02/17/2023   BILITOT 0.5 02/17/2023   ALKPHOS 106 02/17/2023   AST 16 02/17/2023   ALT 14 02/17/2023   ANIONGAP 5 (L) 08/03/2012   Lab Results  Component Value Date   TSH 2.09 02/17/2023     Patient Active Problem List   Diagnosis Date Noted   Colon cancer screening 03/06/2021   Obstructive sleep apnea of adult 12/09/2017   Encounter for screening mammogram for breast cancer 09/15/2017   Morbid obesity ([place]) 07/19/2016   Routine general medical examination at a health care facility 02/14/2015   Depression with anxiety 02/26/2014   Type 2 diabetes mellitus without complications ([place]) [id number]   Essential hypertension, benign 02/26/2014   Hyperlipidemia associated with type 2 diabetes mellitus ([place]) 02/26/2014   Smoking 02/26/2014   GERD (gastroesophageal reflux disease) 02/26/2014   Past Medical History:  Diagnosis Date   Depression    Diabetes mellitus without complication ([place])    GERD (gastroesophageal reflux disease)    Hyperlipidemia    Hypertension    Past Surgical History:  Procedure Laterality Date   ABDOMINAL HYSTERECTOMY  2004   CHOLECYSTECTOMY  2009   SLEEVE GASTROPLASTY  08/02/2012   Social History   Tobacco Use   Smoking status: Every Day    Packs/day: .5    Types: Cigarettes   Smokeless tobacco: Never   Tobacco comments:    Pt smoke less than  half a pack a day.  Substance Use Topics   Alcohol use: No    Alcohol/week: 0.0 standard drinks of alcohol   Drug use: No   Family History  Problem Relation Age of Onset   Cancer Father        Prostate   Diabetes Father    Breast cancer Neg Hx  Allergies  Allergen Reactions   Atorvastatin     GI side effects with the 80 mg     Ampicillin Rash   Penicillins Rash   Current Outpatient Medications on File Prior to Visit  Medication Sig Dispense Refill   clonazePAM (KLONOPIN) 1 MG tablet Take 1 tablet (1 mg total) by mouth 2 (two) times daily as needed. for anxiety 60 tablet 0   Continuous Blood Gluc Receiver (FREESTYLE LIBRE 2 READER) DEVI Use as directed to monitor glucose for DM type 2 (dx. E11.9) 1 each 0   Continuous Blood Gluc Sensor (FREESTYLE LIBRE 2 SENSOR) MISC USE AS DIRECTED TO MONITOR GLUCOSE FOR TYPE 2 DM 6 each 0   esomeprazole (NEXIUM) 20 MG capsule Take 20 mg by mouth daily at 12 noon.     metoprolol tartrate (LOPRESSOR) 50 MG tablet Take 1 tablet (50 mg total) by mouth daily. 90 tablet 3   No current facility-administered medications on file prior to visit.     Review of Systems  Constitutional:  Positive for fatigue. Negative for activity change, appetite change, fever and unexpected weight change.  HENT:  Negative for congestion, ear pain, rhinorrhea, sinus pressure and sore throat.   Eyes:  Negative for pain, redness and visual disturbance.  Respiratory:  Negative for cough, shortness of breath and wheezing.   Cardiovascular:  Negative for chest pain and palpitations.  Gastrointestinal:  Negative for abdominal pain, blood in stool, constipation and diarrhea.  Endocrine: Negative for polydipsia and polyuria.  Genitourinary:  Negative for dysuria, frequency and urgency.  Musculoskeletal:  Positive for arthralgias and back pain. Negative for myalgias.  Skin:  Negative for pallor and rash.  Allergic/Immunologic: Negative for environmental allergies.  Neurological:  Negative for dizziness, syncope and headaches.  Hematological:  Negative for adenopathy. Does not bruise/bleed easily.  Psychiatric/Behavioral:  Negative for decreased concentration and dysphoric mood. The patient is not nervous/anxious.        Objective:   Physical Exam Constitutional:      General: She is not in acute  distress.    Appearance: Normal appearance. She is well-developed. She is obese. She is not ill-appearing or diaphoretic.  HENT:     Head: Normocephalic and atraumatic.     Right Ear: Tympanic membrane, ear canal and external ear normal.     Left Ear: Tympanic membrane, ear canal and external ear normal.     Nose: Nose normal. No congestion.     Mouth/Throat:     Mouth: Mucous membranes are moist.     Pharynx: Oropharynx is clear. No posterior oropharyngeal erythema.  Eyes:     General: No scleral icterus.    Extraocular Movements: Extraocular movements intact.     Conjunctiva/sclera: Conjunctivae normal.     Pupils: Pupils are equal, round, and reactive to light.  Neck:     Thyroid: No thyromegaly.     Vascular: No carotid bruit or JVD.  Cardiovascular:     Rate and Rhythm: Normal rate and regular rhythm.     Pulses: Normal pulses.     Heart sounds: Normal heart sounds.     No gallop.  Pulmonary:     Effort: Pulmonary effort is normal. No respiratory distress.     Breath sounds: Normal breath sounds. No stridor. No wheezing, rhonchi or rales.     Comments: Diffusely distant bs  Chest:     Chest wall: No tenderness.  Abdominal:     General: Bowel sounds are normal. There is no distension  or abdominal bruit.     Palpations: Abdomen is soft. There is no mass.     Tenderness: There is no abdominal tenderness.     Hernia: No hernia is present.  Genitourinary:    Comments: Breast exam: No mass, nodules, thickening, tenderness, bulging, retraction, inflamation, nipple discharge or skin changes noted.  No axillary or clavicular LA.     Musculoskeletal:        General: No tenderness. Normal range of motion.     Cervical back: Normal range of motion and neck supple. No rigidity. No muscular tenderness.     Right lower leg: No edema.     Left lower leg: No edema.     Comments: No kyphosis   Lymphadenopathy:     Cervical: No cervical adenopathy.  Skin:    General: Skin is warm and  dry.     Coloration: Skin is not pale.     Findings: No erythema or rash.     Comments: Solar lentigines diffusely   Neurological:     Mental Status: She is alert. Mental status is at baseline.     Cranial Nerves: No cranial nerve deficit.     Motor: No abnormal muscle tone.     Coordination: Coordination normal.     Gait: Gait normal.     Deep Tendon Reflexes: Reflexes are normal and symmetric. Reflexes normal.  Psychiatric:        Mood and Affect: Mood normal.        Cognition and Memory: Cognition and memory normal.           Assessment & Plan:   Problem List Items Addressed This Visit       Cardiovascular and Mediastinum   Essential hypertension, benign    bp in fair control at this time  BP Readings from Last 1 Encounters:  02/24/23 124/82  No changes needed Most recent labs reviewed  Disc lifstyle change with low sodium diet and exercise  Plan to continue  Lisinopril 10 mg daily  Metoprolol 50 mg daily      Relevant Medications   lisinopril (ZESTRIL) 10 MG tablet   rosuvastatin (CRESTOR) 10 MG tablet     Respiratory   Obstructive sleep apnea of adult    Doing well with cpap        Digestive   GERD (gastroesophageal reflux disease)    Nexium 20 mg daily cannot go without it  [employer]         Endocrine   Hyperlipidemia associated with type 2 diabetes mellitus ([place])    Disc goals for lipids and reasons to control them Rev last labs with pt Rev low sat fat diet in detail Controlled with crestor 10 mg daily  LDL is down to 81       Relevant Medications   lisinopril (ZESTRIL) 10 MG tablet   rosuvastatin (CRESTOR) 10 MG tablet   Type 2 diabetes mellitus without complications ([facility])    Lab Results  Component Value Date   HGBA1C 6.8 (H) 02/17/2023  Not eating quite as well  Struggles with sugar and carbs Has a CGM  on statin and ace Could not afford to continue semaglutide (ins not covering)  Declines metformin  Plans to schedule eye  exam Good foot care  Microalb utd       Relevant Medications   lisinopril (ZESTRIL) 10 MG tablet   rosuvastatin (CRESTOR) 10 MG tablet     Other   Colon cancer screening  Declines any screening incl colonoscopy or ifob kit or cologuard Disc pros and cons      Depression with anxiety    PHQ score of 11 Overall improved  On lexapro 20 mg daily  Occ klonopin   Reviewed stressors/ coping techniques/symptoms/ support sources/ tx options and side effects in detail today Enc good self care        Relevant Medications   escitalopram (LEXAPRO) 20 MG tablet   Encounter for screening mammogram for breast cancer    Mammogram ordered Pt will call to schedule      Relevant Orders   MM 3D SCREENING MAMMOGRAM BILATERAL BREAST   Morbid obesity ([place])    Discussed how this problem influences overall health and the risks it imposes  Reviewed plan for weight loss with lower calorie diet (via better food choices and also portion control or program like weight watchers) and exercise building up to or more than 30 minutes 5 days per week including some aerobic activity        Routine general medical examination at a health care facility - Primary    Reviewed health habits including diet and exercise and skin cancer prevention Reviewed appropriate screening tests for age  Also reviewed health mt list, fam hx and immunization status , as well as social and family history   See HPI Labs reviewed  Eye exam is up to date  Declines shingrix and flu shots Declines colon cancer screening  Mammogram ordered  Recommended smoking cessation /not ready yet Stable to improved mood with PHQ of 11      Smoking    Disc in detail risks of smoking and possible outcomes including copd, vascular/ heart disease, cancer , respiratory and sinus infections  Pt voices understanding Cut down to 1/2 ppd or less  Not ready to quit Offered help when ready

## 2023-02-24 NOTE — Assessment & Plan Note (Signed)
Nexium 20 mg daily cannot go without it  [employer]

## 2023-02-24 NOTE — Assessment & Plan Note (Signed)
Disc goals for lipids and reasons to control them Rev last labs with pt Rev low sat fat diet in detail Controlled with crestor 10 mg daily  LDL is down to 81

## 2023-02-24 NOTE — Assessment & Plan Note (Signed)
Declines any screening incl colonoscopy or ifob kit or cologuard Disc pros and cons

## 2023-02-24 NOTE — Assessment & Plan Note (Signed)
Lab Results  Component Value Date   HGBA1C 6.8 (H) 02/17/2023   Not eating quite as well  Struggles with sugar and carbs Has a CGM  on statin and ace Could not afford to continue semaglutide (ins not covering)  Declines metformin  Plans to schedule eye exam Good foot care  Microalb utd

## 2023-02-24 NOTE — Assessment & Plan Note (Signed)
bp in fair control at this time  BP Readings from Last 1 Encounters:  02/24/23 124/82   No changes needed Most recent labs reviewed  Disc lifstyle change with low sodium diet and exercise  Plan to continue  Lisinopril 10 mg daily  Metoprolol 50 mg daily

## 2023-02-24 NOTE — Assessment & Plan Note (Signed)
Mammogram ordered °Pt will call to schedule  °

## 2023-02-24 NOTE — Assessment & Plan Note (Addendum)
PHQ score of 11 Overall improved  On lexapro 20 mg daily  Occ klonopin   Reviewed stressors/ coping techniques/symptoms/ support sources/ tx options and side effects in detail today Enc good self care

## 2023-02-25 NOTE — Telephone Encounter (Signed)
Form faxed and copy sent to scanning. Pt notified also

## 2023-02-25 NOTE — Telephone Encounter (Signed)
Done and in IN box 

## 2023-03-08 ENCOUNTER — Ambulatory Visit: Payer: 59 | Admitting: Family Medicine

## 2023-03-19 ENCOUNTER — Other Ambulatory Visit: Payer: Self-pay | Admitting: Family Medicine

## 2023-03-22 NOTE — Telephone Encounter (Signed)
Name of Medication: Klonopin  Name of Pharmacy: [facility] [address] Last Fill or Written Date and Quantity: 09/22/22 #60 tab/ 0 refill Last Office Visit and Type: CPE on 02/21/23 Next Office Visit and Type: none scheduled

## 2023-06-23 ENCOUNTER — Other Ambulatory Visit: Payer: Self-pay | Admitting: Family Medicine

## 2023-06-23 NOTE — Telephone Encounter (Signed)
Name of Medication: Klonopin  Name of Pharmacy: [facility] [address] Last Fill or Written Date and Quantity: 03/22/23 #60 tab/ 0 refill Last Office Visit and Type: CPE on 02/21/23 Next Office Visit and Type: none scheduled  Metoprolol filled on 03/24/22 #90 tabs/ 0 refill

## 2023-11-25 ENCOUNTER — Other Ambulatory Visit: Payer: Self-pay | Admitting: Family Medicine

## 2023-11-26 NOTE — Telephone Encounter (Signed)
Name of Medication: Klonopin  Name of Pharmacy: [facility] [address] Last Fill or Written Date and Quantity: 06/23/23 #60 tab/ 0 refill Last Office Visit and Type: CPE on 02/24/23 Next Office Visit and Type: none scheduled

## 2023-12-10 ENCOUNTER — Other Ambulatory Visit: Payer: 59

## 2023-12-12 ENCOUNTER — Telehealth: Payer: Self-pay | Admitting: Family Medicine

## 2023-12-12 DIAGNOSIS — E1169 Type 2 diabetes mellitus with other specified complication: Secondary | ICD-10-CM

## 2023-12-12 DIAGNOSIS — E119 Type 2 diabetes mellitus without complications: Secondary | ICD-10-CM

## 2023-12-12 DIAGNOSIS — Z79899 Other long term (current) drug therapy: Secondary | ICD-10-CM | POA: Insufficient documentation

## 2023-12-12 DIAGNOSIS — I10 Essential (primary) hypertension: Secondary | ICD-10-CM

## 2023-12-12 NOTE — Telephone Encounter (Signed)
-----   Message from Vincenza Hews sent at 12/09/2023 12:35 PM EST ----- Regarding: Lab Tues 12/14/23 Hello,  Patient is coming in for CPE labs on Tuesday 12/14/23. Can we get orders please.   Thanks

## 2023-12-14 ENCOUNTER — Telehealth: Payer: Self-pay

## 2023-12-14 ENCOUNTER — Other Ambulatory Visit: Payer: 59

## 2023-12-14 NOTE — Telephone Encounter (Signed)
 Rebekah Benton

## 2023-12-14 NOTE — Telephone Encounter (Signed)
 Sending note to [facility] admin.

## 2023-12-15 ENCOUNTER — Encounter: Payer: 59 | Admitting: Family Medicine

## 2023-12-17 ENCOUNTER — Encounter: Payer: Self-pay | Admitting: Family Medicine

## 2023-12-17 ENCOUNTER — Ambulatory Visit (INDEPENDENT_AMBULATORY_CARE_PROVIDER_SITE_OTHER): Payer: 59 | Admitting: Family Medicine

## 2023-12-17 VITALS — BP 122/80 | HR 73 | Temp 98.4°F | Ht 66.5 in | Wt 261.5 lb

## 2023-12-17 DIAGNOSIS — E785 Hyperlipidemia, unspecified: Secondary | ICD-10-CM

## 2023-12-17 DIAGNOSIS — Z0000 Encounter for general adult medical examination without abnormal findings: Secondary | ICD-10-CM | POA: Diagnosis not present

## 2023-12-17 DIAGNOSIS — I10 Essential (primary) hypertension: Secondary | ICD-10-CM

## 2023-12-17 DIAGNOSIS — F17200 Nicotine dependence, unspecified, uncomplicated: Secondary | ICD-10-CM

## 2023-12-17 DIAGNOSIS — Z79899 Other long term (current) drug therapy: Secondary | ICD-10-CM

## 2023-12-17 DIAGNOSIS — Z8249 Family history of ischemic heart disease and other diseases of the circulatory system: Secondary | ICD-10-CM

## 2023-12-17 DIAGNOSIS — G4733 Obstructive sleep apnea (adult) (pediatric): Secondary | ICD-10-CM

## 2023-12-17 DIAGNOSIS — E1169 Type 2 diabetes mellitus with other specified complication: Secondary | ICD-10-CM | POA: Diagnosis not present

## 2023-12-17 DIAGNOSIS — R931 Abnormal findings on diagnostic imaging of heart and coronary circulation: Secondary | ICD-10-CM

## 2023-12-17 DIAGNOSIS — E119 Type 2 diabetes mellitus without complications: Secondary | ICD-10-CM

## 2023-12-17 DIAGNOSIS — F418 Other specified anxiety disorders: Secondary | ICD-10-CM

## 2023-12-17 DIAGNOSIS — Z1211 Encounter for screening for malignant neoplasm of colon: Secondary | ICD-10-CM

## 2023-12-17 DIAGNOSIS — K219 Gastro-esophageal reflux disease without esophagitis: Secondary | ICD-10-CM

## 2023-12-17 DIAGNOSIS — Z1231 Encounter for screening mammogram for malignant neoplasm of breast: Secondary | ICD-10-CM

## 2023-12-17 DIAGNOSIS — Z7984 Long term (current) use of oral hypoglycemic drugs: Secondary | ICD-10-CM

## 2023-12-17 NOTE — Patient Instructions (Addendum)
I put the referral in for coronary calcium scan  Please let us know if you don't hear in 1-2 weeks   If you don't hear from the cologuard company in 1-2 weeks let us know   For bone health  Try to get 1200-1500 mg of calcium per day with at least 2000 iu of vitamin D - for bone health   Add some strength training to your routine, this is important for bone and brain health and can reduce your risk of falls and help your body use insulin properly and regulate weight  Light weights, exercise bands , and internet videos are a good way to start  Yoga (chair or regular), machines , floor exercises or a gym with machines are also good options    Take care of yourself    Labs today   You have an order for:  []   2D Mammogram  [x]   3D Mammogram  []   Bone Density     Please call for appointment:   [x]   [facility]  [address] [place] [address]  [phone]  []   [facility])   [address]  [place], [place] [address]  [phone]  []   [facility]      [address] [place], [place]        [phone]         []   [facility]  [address] [place], [place]  [phone]  []  [facility] [facility]   [address]   [place], [place] [address]  [phone]  []  [facility]  [address] [place], [place] [address] [phone]    Make sure to wear two piece clothing  No lotions powders or deodorants the day of the appointment Make sure to bring picture ID and insurance card.  Bring list of medications you are currently taking including any supplements.   Schedule your screening mammogram through MyChart!   Select [place] imaging sites can now be scheduled through MyChart.  Log into your MyChart account.  Go to  'Visit' (or 'Appointments' if  on mobile App) --> Schedule an  Appointment  Under 'Select a Reason for Visit' choose the Mammogram  Screening option.  Complete the pre-visit questions  and select the time and place that  best fits your schedule

## 2023-12-17 NOTE — Addendum Note (Signed)
Addended by: Shon Millet on: 12/17/2023 01:53 PM   Modules accepted: Orders

## 2023-12-17 NOTE — Progress Notes (Unsigned)
Subjective:    Patient ID: Rebekah Benton, female    DOB: July 03, 1971, 53 y.o.   MRN: [id number]  HPI  Here for health maintenance exam and to review chronic medical problems   Wt Readings from Last 3 Encounters:  12/17/23 261 lb 8 oz (118.6 kg)  02/24/23 257 lb 4 oz (116.7 kg)  03/24/22 251 lb 8 oz (114.1 kg)   41.57 kg/m  Vitals:   12/17/23 1359 12/17/23 1431  BP: (!) 130/92 122/80  Pulse: 73   Temp: 98.4 F (36.9 C)   SpO2: 96%     Immunization History  Administered Date(s) Administered   PFIZER(Purple Top)SARS-COV-2 Vaccination 05/17/2020, 06/22/2020   Tdap 02/26/2014    Health Maintenance Due  Topic Date Due   MAMMOGRAM  03/04/2023   FOOT EXAM  03/25/2023   OPHTHALMOLOGY EXAM  08/16/2023   Pna vaccine -declines   Hiv/hep C screen- declines /low risk  Shingrix-declines   Flu shot -declines    Mammogram 2023-forgot last year  Self breast exam- normal lumps   Gyn health-past hysterectomy  No gyn problems   Fam history  Brother died of heart problems / severe  Had stents   She is interested in coronary ca score     Colon cancer screening -declined in past  Interested in cologuard    Bone health   Falls-none  Fractures-none  Supplements : none    Exercise :  Daily activity Yard work  Has a weighted hula hoop     Mood    12/17/2023    2:07 PM 02/24/2023    8:10 AM 03/24/2022   10:11 AM 03/06/2021    9:08 AM 01/09/2020    9:42 AM  Depression screen PHQ 2/9  Decreased Interest 1 1 3 3 1   Down, Depressed, Hopeless 0 1 3 3    PHQ - 2 Score 1 2 6 6 1   Altered sleeping 3 3 3 3 3   Tired, decreased energy 2 2 3 3 3   Change in appetite 2 3 3 3 2   Feeling bad or failure about yourself  0 0 3 3 0  Trouble concentrating 2 1 3 3 2   Moving slowly or fidgety/restless 0 0 0 2 0  Suicidal thoughts 0 0 0 0 0  PHQ-9 Score 10 11 21 23 11   Difficult doing work/chores Very difficult Somewhat difficult  Very difficult       12/17/2023    2:07  PM 02/24/2023    8:10 AM  GAD 7 : Generalized Anxiety Score  Nervous, Anxious, on Edge 2 2  Control/stop worrying 2 1  Worry too much - different things 2 1  Trouble relaxing 2 2  Restless 0 0  Easily annoyed or irritable 0 0  Afraid - awful might happen 1 1  Total GAD 7 Score 9 7  Anxiety Difficulty Somewhat difficult Somewhat difficult     Depression with anxiety  Lexapro 20 mg daily  Clonazepam prn 1 mg   Still struggles with anxiety  Overall a little better  She did do therapy through work     HTN bp is stable today  No cp or palpitations or headaches or edema  No side effects to medicines  BP Readings from Last 3 Encounters:  12/17/23 122/80  02/24/23 124/82  03/24/22 126/82    Lisinopril 10 mg daily  Metoprolol 50 mg daily    Pulse Readings from Last 3 Encounters:  12/17/23 73  02/24/23 76  03/24/22 89  Smoking status  About 1/2 ppd  Not ready to quit yet  No breathing problems      Lab Results  Component Value Date   NA 140 12/17/2023   K 5.2 12/17/2023   CO2 25 12/17/2023   GLUCOSE 98 12/17/2023   BUN 10 12/17/2023   CREATININE 0.94 12/17/2023   CALCIUM 9.7 12/17/2023   GFR 72.19 02/17/2023   GFRNONAA >60 08/03/2012   Due for labs   Gerd Nexium 20 mg daily  Lab Results  Component Value Date   VITAMINB12 467 12/17/2023    DM2 Lab Results  Component Value Date   HGBA1C 6.8 (H) 12/17/2023   HGBA1C 6.8 (H) 02/17/2023   HGBA1C 6.4 03/04/2022   Due for labs  Has CGM-according to that A1c should be around 6.4  Gets this free through work with a program  Ace and statin   Did take semaglutide then ins stopped covering   Lab Results  Component Value Date   MICROALBUR 1.7 02/17/2023       Hyperlipidemia Lab Results  Component Value Date   CHOL 293 (H) 12/17/2023   HDL 55 12/17/2023   LDLCALC 195 (H) 12/17/2023   TRIG 230 (H) 12/17/2023   CHOLHDL 5.3 (H) 12/17/2023   Due for labs  Crestor 10 mg daily    Patient  Active Problem List   Diagnosis Date Noted   Family history of early CAD 12/17/2023   Current use of proton pump inhibitor 12/12/2023   Colon cancer screening 03/06/2021   Obstructive sleep apnea of adult 12/09/2017   Encounter for screening mammogram for breast cancer 09/15/2017   Morbid obesity (HCC) 07/19/2016   Routine general medical examination at a health care facility 02/14/2015   Depression with anxiety 02/26/2014   Type 2 diabetes mellitus without complications (HCC) 02/26/2014   Essential hypertension, benign 02/26/2014   Hyperlipidemia associated with type 2 diabetes mellitus (HCC) 02/26/2014   Smoking 02/26/2014   GERD (gastroesophageal reflux disease) 02/26/2014   Past Medical History:  Diagnosis Date   Depression    Diabetes mellitus without complication (HCC)    GERD (gastroesophageal reflux disease)    Hyperlipidemia    Hypertension    Past Surgical History:  Procedure Laterality Date   ABDOMINAL HYSTERECTOMY  2004   CHOLECYSTECTOMY  2009   SLEEVE GASTROPLASTY  08/02/2012   Social History   Tobacco Use   Smoking status: Every Day    Current packs/day: 0.50    Types: Cigarettes   Smokeless tobacco: Never   Tobacco comments:    Pt smoke less than  half a pack a day.  Substance Use Topics   Alcohol use: No    Alcohol/week: 0.0 standard drinks of alcohol   Drug use: No   Family History  Problem Relation Age of Onset   Cancer Father        Prostate   Diabetes Father    CAD Brother    Breast cancer Neg Hx    Allergies  Allergen Reactions   Atorvastatin     GI side effects with the 80 mg    Ampicillin Rash   Penicillins Rash   Current Outpatient Medications on File Prior to Visit  Medication Sig Dispense Refill   clonazePAM (KLONOPIN) 1 MG tablet Take 1 tablet by mouth twice daily as needed for anxiety 60 tablet 0   Continuous Blood Gluc Receiver (FREESTYLE LIBRE 2 READER) DEVI Use as directed to monitor glucose for DM type 2 (dx. E11.9) 1  each 0    Continuous Blood Gluc Sensor (FREESTYLE LIBRE 2 SENSOR) MISC USE AS DIRECTED TO MONITOR GLUCOSE FOR TYPE 2 DM 6 each 0   escitalopram (LEXAPRO) 20 MG tablet Take 1 tablet (20 mg total) by mouth daily. 90 tablet 3   esomeprazole (NEXIUM) 20 MG capsule Take 20 mg by mouth daily at 12 noon.     lisinopril (ZESTRIL) 10 MG tablet Take 1 tablet (10 mg total) by mouth daily. 90 tablet 3   metoprolol tartrate (LOPRESSOR) 50 MG tablet Take 1 tablet by mouth once daily 90 tablet 2   rosuvastatin (CRESTOR) 10 MG tablet Take 1 tablet (10 mg total) by mouth daily. 90 tablet 3   No current facility-administered medications on file prior to visit.    Review of Systems  Constitutional:  Negative for activity change, appetite change, fatigue, fever and unexpected weight change.  HENT:  Negative for congestion, ear pain, rhinorrhea, sinus pressure and sore throat.   Eyes:  Negative for pain, redness and visual disturbance.  Respiratory:  Negative for cough, shortness of breath and wheezing.   Cardiovascular:  Negative for chest pain and palpitations.  Gastrointestinal:  Negative for abdominal pain, blood in stool, constipation and diarrhea.  Endocrine: Negative for polydipsia and polyuria.  Genitourinary:  Negative for dysuria, frequency and urgency.  Musculoskeletal:  Negative for arthralgias, back pain and myalgias.  Skin:  Negative for pallor and rash.  Allergic/Immunologic: Negative for environmental allergies.  Neurological:  Negative for dizziness, syncope and headaches.  Hematological:  Negative for adenopathy. Does not bruise/bleed easily.  Psychiatric/Behavioral:  Negative for decreased concentration. The patient is nervous/anxious.        Objective:   Physical Exam Constitutional:      General: She is not in acute distress.    Appearance: Normal appearance. She is well-developed. She is not ill-appearing or diaphoretic.  HENT:     Head: Normocephalic and atraumatic.     Right Ear:  Tympanic membrane, ear canal and external ear normal.     Left Ear: Tympanic membrane, ear canal and external ear normal.     Nose: Nose normal. No congestion.     Mouth/Throat:     Mouth: Mucous membranes are moist.     Pharynx: Oropharynx is clear. No posterior oropharyngeal erythema.  Eyes:     General: No scleral icterus.    Extraocular Movements: Extraocular movements intact.     Conjunctiva/sclera: Conjunctivae normal.     Pupils: Pupils are equal, round, and reactive to light.  Neck:     Thyroid: No thyromegaly.     Vascular: No carotid bruit or JVD.  Cardiovascular:     Rate and Rhythm: Normal rate and regular rhythm.     Pulses: Normal pulses.     Heart sounds: Normal heart sounds.     No gallop.  Pulmonary:     Effort: Pulmonary effort is normal. No respiratory distress.     Breath sounds: Normal breath sounds. No wheezing.     Comments: Good air exch Chest:     Chest wall: No tenderness.  Abdominal:     General: Bowel sounds are normal. There is no distension or abdominal bruit.     Palpations: Abdomen is soft. There is no mass.     Tenderness: There is no abdominal tenderness.     Hernia: No hernia is present.  Genitourinary:    Comments: Breast exam: No mass, nodules, thickening, tenderness, bulging, retraction, inflamation, nipple discharge or skin changes  noted.  No axillary or clavicular LA.     Musculoskeletal:        General: No tenderness. Normal range of motion.     Cervical back: Normal range of motion and neck supple. No rigidity. No muscular tenderness.     Right lower leg: No edema.     Left lower leg: No edema.     Comments: No kyphosis   Lymphadenopathy:     Cervical: No cervical adenopathy.  Skin:    General: Skin is warm and dry.     Coloration: Skin is not pale.     Findings: No erythema or rash.     Comments: Solar lentigines diffusely   Neurological:     Mental Status: She is alert. Mental status is at baseline.     Cranial Nerves: No  cranial nerve deficit.     Motor: No abnormal muscle tone.     Coordination: Coordination normal.     Gait: Gait normal.     Deep Tendon Reflexes: Reflexes are normal and symmetric. Reflexes normal.  Psychiatric:        Mood and Affect: Mood normal.        Cognition and Memory: Cognition and memory normal.           Assessment & Plan:   Problem List Items Addressed This Visit       Cardiovascular and Mediastinum   Essential hypertension, benign   bp in fair control at this time  BP Readings from Last 1 Encounters:  12/17/23 122/80   No changes needed Most recent labs reviewed  Disc lifstyle change with low sodium diet and exercise  Plan to continue  Lisinopril 10 mg daily  Metoprolol 50 mg daily  Labs today      Relevant Orders   CT CARDIAC SCORING (SELF PAY ONLY)     Respiratory   Obstructive sleep apnea of adult   Doing well with cpap        Digestive   GERD (gastroesophageal reflux disease)   Takes nexium 20 mg daily  Controlled  Encouraged to avoid triggers  B12 and d with labs today        Endocrine   Type 2 diabetes mellitus without complications (HCC)   A1c ordered  Last was 6.8 Pt chooses to avoid medication  Both metfromin and glp-1 (if insurance covered again - encouraged pt to check on formulary)  would help  Discussed low glycemic diet /exercise / health habits On a statin  Microalb utd Sent for eye exam         Relevant Orders   CT CARDIAC SCORING (SELF PAY ONLY)   Hyperlipidemia associated with type 2 diabetes mellitus (HCC)   Disc goals for lipids and reasons to control them Rev last labs with pt Rev low sat fat diet in detail Taking rosuvastatin 10 mg daily   Lab today      Relevant Orders   CT CARDIAC SCORING (SELF PAY ONLY)     Other   Smoking   Disc in detail risks of smoking and possible outcomes including copd, vascular/ heart disease, cancer , respiratory and sinus infections  Pt voices understanding 1/2  ppd Not ready to quit No breathing problems       Routine general medical examination at a health care facility - Primary   Reviewed health habits including diet and exercise and skin cancer prevention Reviewed appropriate screening tests for age  Also reviewed health mt list, fam hx and  immunization status , as well as social and family history   See HPI Labs reviewed and ordered Declines pna , shingris and flu vaccines  Declines HIV and hep C screen due to low risk  Cardiac ca score ordered Mammogram ordered  Cologuard ordered Discussed fall prevention, supplements and exercise for bone density  PHQ is 10 - overall improved with lexapro 20  Health Maintenance  Topic Date Due   Mammogram  03/04/2023   Complete foot exam   03/25/2023   Eye exam for diabetics  08/16/2023   Flu Shot  02/28/2024*   Zoster (Shingles) Vaccine (1 of 2) 03/16/2024*   Pneumococcal Vaccination (1 of 2 - PCV) 12/16/2024*   Colon Cancer Screening  12/16/2024*   Hepatitis C Screening  12/16/2024*   HIV Screening  12/16/2024*   COVID-19 Vaccine (3 - Pfizer risk series) 01/01/2026*   Yearly kidney health urinalysis for diabetes  02/17/2024   DTaP/Tdap/Td vaccine (2 - Td or Tdap) 02/27/2024   Hemoglobin A1C  06/15/2024   Yearly kidney function blood test for diabetes  12/16/2024   HPV Vaccine  Aged Out  *Topic was postponed. The date shown is not the original due date.         Morbid obesity (HCC)   Discussed how this problem influences overall health and the risks it imposes  Reviewed plan for weight loss with lower calorie diet (via better food choices (lower glycemic and portion control) along with exercise building up to or more than 30 minutes 5 days per week including some aerobic activity and strength training   Took semaglutide briefly and ins stopped covering it  Encouraged her to check formulary again  Pt prefers to control with lifestyle as long as she can  Encouraged weight loss Microalb  utd       Family history of early CAD   In setting of controlled HTN and hyperlipidemia on statin   Cardiac calcium score test ordered        Relevant Orders   CT CARDIAC SCORING (SELF PAY ONLY)   Encounter for screening mammogram for breast cancer   Mammogram ordered Pt will call to schedule       Relevant Orders   MM 3D SCREENING MAMMOGRAM BILATERAL BREAST   Depression with anxiety   Currently on lexapro 20 mg daily  Clonazepam prn 1 mg  Struggling with anxiety  Overall improved Did some mental health counseling through work       Current use of proton pump inhibitor   Vit b12 and D added to labs      Colon cancer screening   Cologuard ordered  Pt is open to colonoscopy if this is positive       Relevant Orders   Cologuard

## 2023-12-18 LAB — CBC WITH DIFFERENTIAL/PLATELET
Absolute Lymphocytes: 3943 {cells}/uL — ABNORMAL HIGH (ref 850–3900)
Absolute Monocytes: 596 {cells}/uL (ref 200–950)
Basophils Absolute: 53 {cells}/uL (ref 0–200)
Basophils Relative: 0.6 %
Eosinophils Absolute: 134 {cells}/uL (ref 15–500)
Eosinophils Relative: 1.5 %
HCT: 43.6 % (ref 35.0–45.0)
Hemoglobin: 14.7 g/dL (ref 11.7–15.5)
MCH: 30.8 pg (ref 27.0–33.0)
MCHC: 33.7 g/dL (ref 32.0–36.0)
MCV: 91.2 fL (ref 80.0–100.0)
MPV: 11 fL (ref 7.5–12.5)
Monocytes Relative: 6.7 %
Neutro Abs: 4174 {cells}/uL (ref 1500–7800)
Neutrophils Relative %: 46.9 %
Platelets: 288 10*3/uL (ref 140–400)
RBC: 4.78 10*6/uL (ref 3.80–5.10)
RDW: 12.9 % (ref 11.0–15.0)
Total Lymphocyte: 44.3 %
WBC: 8.9 10*3/uL (ref 3.8–10.8)

## 2023-12-18 LAB — COMPREHENSIVE METABOLIC PANEL
AG Ratio: 1.6 (calc) (ref 1.0–2.5)
ALT: 32 U/L — ABNORMAL HIGH (ref 6–29)
AST: 27 U/L (ref 10–35)
Albumin: 4.4 g/dL (ref 3.6–5.1)
Alkaline phosphatase (APISO): 118 U/L (ref 37–153)
BUN: 10 mg/dL (ref 7–25)
CO2: 25 mmol/L (ref 20–32)
Calcium: 9.7 mg/dL (ref 8.6–10.4)
Chloride: 102 mmol/L (ref 98–110)
Creat: 0.94 mg/dL (ref 0.50–1.03)
Globulin: 2.7 g/dL (ref 1.9–3.7)
Glucose, Bld: 98 mg/dL (ref 65–99)
Potassium: 5.2 mmol/L (ref 3.5–5.3)
Sodium: 140 mmol/L (ref 135–146)
Total Bilirubin: 0.5 mg/dL (ref 0.2–1.2)
Total Protein: 7.1 g/dL (ref 6.1–8.1)

## 2023-12-18 LAB — HEMOGLOBIN A1C
Hgb A1c MFr Bld: 6.8 %{Hb} — ABNORMAL HIGH (ref <5.7)
Mean Plasma Glucose: 148 mg/dL
eAG (mmol/L): 8.2 mmol/L

## 2023-12-18 LAB — VITAMIN D 25 HYDROXY (VIT D DEFICIENCY, FRACTURES): Vit D, 25-Hydroxy: 10 ng/mL — ABNORMAL LOW (ref 30–100)

## 2023-12-18 LAB — LIPID PANEL
Cholesterol: 293 mg/dL — ABNORMAL HIGH (ref <200)
HDL: 55 mg/dL (ref >=50)
LDL Cholesterol (Calc): 195 mg/dL — ABNORMAL HIGH
Non-HDL Cholesterol (Calc): 238 mg/dL — ABNORMAL HIGH (ref <130)
Total CHOL/HDL Ratio: 5.3 (calc) — ABNORMAL HIGH (ref <5.0)
Triglycerides: 230 mg/dL — ABNORMAL HIGH (ref <150)

## 2023-12-18 LAB — TSH: TSH: 1.68 m[IU]/L

## 2023-12-18 LAB — VITAMIN B12: Vitamin B-12: 467 pg/mL (ref 200–1100)

## 2023-12-19 ENCOUNTER — Encounter: Payer: Self-pay | Admitting: Family Medicine

## 2023-12-19 NOTE — Assessment & Plan Note (Signed)
Vit b12 and D added to labs

## 2023-12-19 NOTE — Assessment & Plan Note (Signed)
bp in fair control at this time  BP Readings from Last 1 Encounters:  12/17/23 122/80   No changes needed Most recent labs reviewed  Disc lifstyle change with low sodium diet and exercise  Plan to continue  Lisinopril 10 mg daily  Metoprolol 50 mg daily  Labs today

## 2023-12-19 NOTE — Assessment & Plan Note (Signed)
Discussed how this problem influences overall health and the risks it imposes  Reviewed plan for weight loss with lower calorie diet (via better food choices (lower glycemic and portion control) along with exercise building up to or more than 30 minutes 5 days per week including some aerobic activity and strength training   Took semaglutide briefly and ins stopped covering it  Encouraged her to check formulary again  Pt prefers to control with lifestyle as long as she can  Encouraged weight loss Microalb utd

## 2023-12-19 NOTE — Assessment & Plan Note (Signed)
Currently on lexapro 20 mg daily  Clonazepam prn 1 mg  Struggling with anxiety  Overall improved Did some mental health counseling through work

## 2023-12-19 NOTE — Assessment & Plan Note (Signed)
Doing well with cpap.

## 2023-12-19 NOTE — Assessment & Plan Note (Signed)
In setting of controlled HTN and hyperlipidemia on statin   Cardiac calcium score test ordered

## 2023-12-19 NOTE — Assessment & Plan Note (Signed)
Reviewed health habits including diet and exercise and skin cancer prevention Reviewed appropriate screening tests for age  Also reviewed health mt list, fam hx and immunization status , as well as social and family history   See HPI Labs reviewed and ordered Declines pna , shingris and flu vaccines  Declines HIV and hep C screen due to low risk  Cardiac ca score ordered Mammogram ordered  Cologuard ordered Discussed fall prevention, supplements and exercise for bone density  PHQ is 10 - overall improved with lexapro 20  Health Maintenance  Topic Date Due   Mammogram  03/04/2023   Complete foot exam   03/25/2023   Eye exam for diabetics  08/16/2023   Flu Shot  02/28/2024*   Zoster (Shingles) Vaccine (1 of 2) 03/16/2024*   Pneumococcal Vaccination (1 of 2 - PCV) 12/16/2024*   Colon Cancer Screening  12/16/2024*   Hepatitis C Screening  12/16/2024*   HIV Screening  12/16/2024*   COVID-19 Vaccine (3 - Pfizer risk series) 01/01/2026*   Yearly kidney health urinalysis for diabetes  02/17/2024   DTaP/Tdap/Td vaccine (2 - Td or Tdap) 02/27/2024   Hemoglobin A1C  06/15/2024   Yearly kidney function blood test for diabetes  12/16/2024   HPV Vaccine  Aged Out  *Topic was postponed. The date shown is not the original due date.

## 2023-12-19 NOTE — Assessment & Plan Note (Signed)
Takes nexium 20 mg daily  Controlled  Encouraged to avoid triggers  B12 and d with labs today

## 2023-12-19 NOTE — Assessment & Plan Note (Signed)
Cologuard ordered  Pt is open to colonoscopy if this is positive

## 2023-12-19 NOTE — Assessment & Plan Note (Signed)
Mammogram ordered °Pt will call to schedule  °

## 2023-12-19 NOTE — Assessment & Plan Note (Signed)
Disc goals for lipids and reasons to control them Rev last labs with pt Rev low sat fat diet in detail Taking rosuvastatin 10 mg daily   Lab today

## 2023-12-19 NOTE — Assessment & Plan Note (Signed)
Disc in detail risks of smoking and possible outcomes including copd, vascular/ heart disease, cancer , respiratory and sinus infections  Pt voices understanding 1/2 ppd Not ready to quit No breathing problems

## 2023-12-19 NOTE — Assessment & Plan Note (Signed)
A1c ordered  Last was 6.8 Pt chooses to avoid medication  Both metfromin and glp-1 (if insurance covered again - encouraged pt to check on formulary)  would help  Discussed low glycemic diet /exercise / health habits On a statin  Microalb utd Sent for eye exam

## 2023-12-21 NOTE — Addendum Note (Signed)
Addended by: Wendie Simmer B on: 12/21/2023 01:35 PM   Modules accepted: Orders

## 2023-12-22 NOTE — Telephone Encounter (Signed)
Form placed in your inbox

## 2023-12-23 NOTE — Telephone Encounter (Signed)
Dr. Milinda Antis completed form and I faxed it back to # on form. Pt notified copy sent to scanning and also mailed to pt.

## 2023-12-24 ENCOUNTER — Ambulatory Visit
Admission: RE | Admit: 2023-12-24 | Discharge: 2023-12-24 | Disposition: A | Discharge status: Home / Self Care | Payer: Self-pay | Source: Ambulatory Visit | Attending: Family Medicine | Admitting: Family Medicine

## 2023-12-24 DIAGNOSIS — E1169 Type 2 diabetes mellitus with other specified complication: Secondary | ICD-10-CM

## 2023-12-24 DIAGNOSIS — Z8249 Family history of ischemic heart disease and other diseases of the circulatory system: Secondary | ICD-10-CM

## 2023-12-24 DIAGNOSIS — E119 Type 2 diabetes mellitus without complications: Secondary | ICD-10-CM

## 2023-12-24 DIAGNOSIS — E785 Hyperlipidemia, unspecified: Secondary | ICD-10-CM | POA: Insufficient documentation

## 2023-12-24 DIAGNOSIS — I10 Essential (primary) hypertension: Secondary | ICD-10-CM

## 2023-12-25 ENCOUNTER — Encounter: Payer: Self-pay | Admitting: Family Medicine

## 2023-12-27 DIAGNOSIS — R931 Abnormal findings on diagnostic imaging of heart and coronary circulation: Secondary | ICD-10-CM | POA: Insufficient documentation

## 2023-12-27 NOTE — Addendum Note (Signed)
Addended by: Roxy Manns A on: 12/27/2023 10:54 AM   Modules accepted: Orders

## 2024-02-25 ENCOUNTER — Encounter

## 2024-03-04 ENCOUNTER — Other Ambulatory Visit: Payer: Self-pay | Admitting: Family Medicine

## 2024-03-10 ENCOUNTER — Ambulatory Visit
Admission: RE | Admit: 2024-03-10 | Discharge: 2024-03-10 | Disposition: A | Discharge status: Home / Self Care | Source: Ambulatory Visit | Attending: Family Medicine | Admitting: Family Medicine

## 2024-03-10 DIAGNOSIS — Z1231 Encounter for screening mammogram for malignant neoplasm of breast: Secondary | ICD-10-CM | POA: Diagnosis present

## 2024-03-15 ENCOUNTER — Encounter: Payer: Self-pay | Admitting: Family Medicine

## 2024-03-17 ENCOUNTER — Ambulatory Visit: Payer: 59 | Admitting: Cardiology

## 2024-03-24 ENCOUNTER — Encounter: Payer: Self-pay | Admitting: Family Medicine

## 2024-03-26 MED ORDER — TERCONAZOLE 0.8 % VA CREA: 1.0000 | TOPICAL_CREAM | Freq: Every day | VAGINAL | 0 refills | Status: AC

## 2024-03-26 MED ORDER — FLUCONAZOLE 150 MG PO TABS
150.0000 mg | ORAL_TABLET | Freq: Once | ORAL | 0 refills | Status: AC
Start: 2024-03-26 — End: 2024-03-26

## 2024-04-08 ENCOUNTER — Other Ambulatory Visit: Payer: Self-pay | Admitting: Family Medicine

## 2024-05-31 ENCOUNTER — Encounter: Payer: Self-pay | Admitting: Family Medicine

## 2024-05-31 MED ORDER — ESCITALOPRAM OXALATE 20 MG PO TABS
20.0000 mg | ORAL_TABLET | Freq: Every day | ORAL | 1 refills | Status: AC
Start: 2024-05-31 — End: ?

## 2024-05-31 MED ORDER — LISINOPRIL 10 MG PO TABS
10.0000 mg | ORAL_TABLET | Freq: Every day | ORAL | 1 refills | Status: AC
Start: 2024-05-31 — End: ?

## 2024-05-31 MED ORDER — METOPROLOL TARTRATE 50 MG PO TABS
50.0000 mg | ORAL_TABLET | Freq: Every day | ORAL | 1 refills | Status: DC
Start: 2024-05-31 — End: 2024-06-16

## 2024-05-31 MED ORDER — ROSUVASTATIN CALCIUM 10 MG PO TABS: 10.0000 mg | ORAL_TABLET | Freq: Every day | ORAL | 1 refills | Status: DC

## 2024-06-09 ENCOUNTER — Ambulatory Visit: Payer: 59 | Admitting: Family Medicine

## 2024-06-16 ENCOUNTER — Ambulatory Visit (INDEPENDENT_AMBULATORY_CARE_PROVIDER_SITE_OTHER): Payer: 59 | Admitting: Family Medicine

## 2024-06-16 ENCOUNTER — Encounter: Payer: Self-pay | Admitting: Family Medicine

## 2024-06-16 ENCOUNTER — Encounter: Payer: Self-pay | Admitting: Cardiology

## 2024-06-16 ENCOUNTER — Ambulatory Visit: Attending: Cardiology | Admitting: Cardiology

## 2024-06-16 VITALS — BP 124/82 | HR 71 | Temp 98.4°F | Ht 66.5 in | Wt 260.4 lb

## 2024-06-16 VITALS — BP 126/72 | HR 78 | Ht 66.75 in | Wt 262.8 lb

## 2024-06-16 DIAGNOSIS — Z23 Encounter for immunization: Secondary | ICD-10-CM

## 2024-06-16 DIAGNOSIS — I10 Essential (primary) hypertension: Secondary | ICD-10-CM

## 2024-06-16 DIAGNOSIS — E785 Hyperlipidemia, unspecified: Secondary | ICD-10-CM

## 2024-06-16 DIAGNOSIS — E1169 Type 2 diabetes mellitus with other specified complication: Secondary | ICD-10-CM | POA: Diagnosis not present

## 2024-06-16 DIAGNOSIS — F17200 Nicotine dependence, unspecified, uncomplicated: Secondary | ICD-10-CM

## 2024-06-16 DIAGNOSIS — R072 Precordial pain: Secondary | ICD-10-CM | POA: Diagnosis not present

## 2024-06-16 DIAGNOSIS — I25118 Atherosclerotic heart disease of native coronary artery with other forms of angina pectoris: Secondary | ICD-10-CM | POA: Diagnosis not present

## 2024-06-16 DIAGNOSIS — E119 Type 2 diabetes mellitus without complications: Secondary | ICD-10-CM | POA: Diagnosis not present

## 2024-06-16 DIAGNOSIS — E782 Mixed hyperlipidemia: Secondary | ICD-10-CM | POA: Diagnosis not present

## 2024-06-16 DIAGNOSIS — I2510 Atherosclerotic heart disease of native coronary artery without angina pectoris: Secondary | ICD-10-CM

## 2024-06-16 DIAGNOSIS — I2089 Other forms of angina pectoris: Secondary | ICD-10-CM

## 2024-06-16 LAB — COMPREHENSIVE METABOLIC PANEL WITH GFR
ALT: 29 U/L (ref 0–35)
AST: 22 U/L (ref 0–37)
Albumin: 4.3 g/dL (ref 3.5–5.2)
Alkaline Phosphatase: 107 U/L (ref 39–117)
BUN: 14 mg/dL (ref 6–23)
CO2: 27 meq/L (ref 19–32)
Calcium: 9.4 mg/dL (ref 8.4–10.5)
Chloride: 105 meq/L (ref 96–112)
Creatinine, Ser: 0.89 mg/dL (ref 0.40–1.20)
GFR: 74.42 mL/min (ref >60.00)
Glucose, Bld: 117 mg/dL — ABNORMAL HIGH (ref 70–99)
Potassium: 4.7 meq/L (ref 3.5–5.1)
Sodium: 141 meq/L (ref 135–145)
Total Bilirubin: 0.5 mg/dL (ref 0.2–1.2)
Total Protein: 6.7 g/dL (ref 6.0–8.3)

## 2024-06-16 LAB — LIPID PANEL
Cholesterol: 163 mg/dL (ref 0–200)
HDL: 55.4 mg/dL (ref >39.00)
LDL Cholesterol: 89 mg/dL (ref 0–99)
NonHDL: 108
Total CHOL/HDL Ratio: 3
Triglycerides: 95 mg/dL (ref 0.0–149.0)
VLDL: 19 mg/dL (ref 0.0–40.0)

## 2024-06-16 LAB — MICROALBUMIN / CREATININE URINE RATIO
Creatinine,U: 180.1 mg/dL
Microalb Creat Ratio: 8.5 mg/g (ref 0.0–30.0)
Microalb, Ur: 1.5 mg/dL (ref 0.0–1.9)

## 2024-06-16 LAB — POCT GLYCOSYLATED HEMOGLOBIN (HGB A1C): Hemoglobin A1C: 6.6 % — AB (ref 4.0–5.6)

## 2024-06-16 MED ORDER — METOPROLOL TARTRATE 100 MG PO TABS: ORAL_TABLET | ORAL | 0 refills | Status: AC

## 2024-06-16 MED ORDER — METOPROLOL TARTRATE 25 MG PO TABS
25.0000 mg | ORAL_TABLET | Freq: Two times a day (BID) | ORAL | 3 refills | Status: AC
Start: 2024-06-16 — End: ?

## 2024-06-16 MED ORDER — METOPROLOL TARTRATE 100 MG PO TABS: ORAL_TABLET | ORAL | 0 refills | Status: DC

## 2024-06-16 NOTE — Patient Instructions (Signed)
 Medication Instructions:  -TAKE ONE 100 mg metoprolol  2 hours prior to cardiac CTA -CHANGE metoprolol  to 25 mg twice a day   *If you need a refill on your cardiac medications before your next appointment, please call your pharmacy*  Lab Work: Your provider would like for you to have following labs drawn today bmp.   If you have labs (blood work) drawn today and your tests are completely normal, you will receive your results only by: MyChart Message (if you have MyChart) OR A paper copy in the mail If you have any lab test that is abnormal or we need to change your treatment, we will call you to review the results.  Testing/Procedures: Your physician has requested that you have an echocardiogram. Echocardiography is a painless test that uses sound waves to create images of your heart. It provides your doctor with information about the size and shape of your heart and how well your heart's chambers and valves are working.   You may receive an ultrasound enhancing agent through an IV if needed to better visualize your heart during the echo. This procedure takes approximately one hour.  There are no restrictions for this procedure.  This will take place at [address] [facility]) [address], [place] [address]  Please note: We ask at that you not bring children with you during ultrasound (echo/ vascular) testing. Due to room size and safety concerns, children are not allowed in the ultrasound rooms during exams. Our front office staff cannot provide observation of children in our lobby area while testing is being conducted. An adult accompanying a patient to their appointment will only be allowed in the ultrasound room at the discretion of the ultrasound technician under special circumstances. We apologize for any inconvenience.     Your cardiac CT will be scheduled at:  [facility] [address] [place], [place] [address] [phone]  Please arrive  15 mins early for check-in and test prep.  There is spacious parking and easy access to the radiology department from the [facility] entrance. Please enter here and check-in with the desk attendant.    Please follow these instructions carefully (unless otherwise directed):  An IV will be required for this test and Nitroglycerin  will be given.  Hold all erectile dysfunction medications at least 3 days (72 hrs) prior to test. (Ie viagra, cialis, sildenafil, tadalafil, etc)     On the Night Before the Test: Be sure to Drink plenty of water. Do not consume any caffeinated/decaffeinated beverages or chocolate 12 hours prior to your test. Do not take any antihistamines 12 hours prior to your test.  On the Day of the Test: Drink plenty of water until 1 hour prior to the test. Do not eat any food 1 hour prior to test. You may take your regular medications prior to the test.  Take metoprolol  (Lopressor ) two hours prior to test. If you take Furosemide /Hydrochlorothiazide/Spironolactone/Chlorthalidone, please HOLD on the morning of the test. Patients who wear a continuous glucose monitor MUST remove the device prior to scanning. FEMALES- please wear underwire-free bra if available, avoid dresses & tight clothing       After the Test: Drink plenty of water. After receiving IV contrast, you may experience a mild flushed feeling. This is normal. On occasion, you may experience a mild rash up to 24 hours after the test. This is not dangerous. If this occurs, you can take Benadryl 25 mg, Zyrtec, Claritin, or Allegra  and increase your fluid intake. (Patients taking Tikosyn should avoid Benadryl, and may take Zyrtec, Claritin, or Allegra) If you experience trouble breathing, this can be serious. If it is severe call 911 IMMEDIATELY. If it is mild, please call our office.  We will call to schedule your test 2-4 weeks out understanding that some insurance companies will need an  authorization prior to the service being performed.   For more information and frequently asked questions, please visit our website : [email or web address]  For non-scheduling related questions, please contact the cardiac imaging nurse navigator should you have any questions/concerns: Cardiac Imaging Nurse Navigators Direct Office Dial: [phone]   For scheduling needs, including cancellations and rescheduling, please call [place], [phone].    Follow-Up: At [facility], you and your health needs are our priority.  As part of our continuing mission to provide you with exceptional heart care, our providers are all part of one team.  This team includes your primary Cardiologist (physician) and Advanced Practice Providers or APPs (Physician Assistants and Nurse Practitioners) who all work together to provide you with the care you need, when you need it.  Your next appointment:   3 month(s)  Provider:   You may see Dr. Darliss or one of the following Advanced Practice Providers on your designated Care Team:   Lonni Meager, NP Lesley Maffucci, PA-C Bernardino Bring, PA-C Cadence [place], PA-C Tylene Lunch, NP Barnie Hila, NP    We recommend signing up for the patient portal called MyChart.  Sign up information is provided on this After Visit Summary.  MyChart is used to connect with patients for Virtual Visits (Telemedicine).  Patients are able to view lab/test results, encounter notes, upcoming appointments, etc.  Non-urgent messages can be sent to your provider as well.   To learn more about what you can do with MyChart, go to [email or web address].

## 2024-06-16 NOTE — Assessment & Plan Note (Signed)
 Disc goals for lipids and reasons to control them Rev last labs with pt Rev low sat fat diet in detail Taking rosuvastatin  10 mg daily (back on this - last visit LDL up to 195 when not taking it)   Lab today

## 2024-06-16 NOTE — Assessment & Plan Note (Signed)
 Lab Results  Component Value Date   HGBA1C 6.6 (A) 06/16/2024   HGBA1C 6.8 (H) 12/17/2023   HGBA1C 6.8 (H) 02/17/2023   Microalb ordered  Diet controlled-pt declines medicine  In future -glp-1 med may help with weight loss and impulsive eating  On ace and statin  Sent for eye exam report

## 2024-06-16 NOTE — Assessment & Plan Note (Addendum)
   bp in fair control at this time  BP Readings from Last 1 Encounters:  06/16/24 124/82   No changes needed Most recent labs reviewed  Disc lifstyle change with low sodium diet and exercise  Plan to continue  Lisinopril  10 mg daily  Metoprolol  50 mg daily  Lab today

## 2024-06-16 NOTE — Progress Notes (Signed)
 Subjective:    Patient ID: Stephane Earnie Clarity, female    DOB: 03-03-1971, 53 y.o.   MRN: [id number]  HPI  Wt Readings from Last 3 Encounters:  06/16/24 260 lb 6 oz (118.1 kg)  12/17/23 261 lb 8 oz (118.6 kg)  02/24/23 257 lb 4 oz (116.7 kg)   41.40 kg/m  Vitals:   06/16/24 1056  BP: 124/82  Pulse: 71  Temp: 98.4 F (36.9 C)  SpO2: 96%    Pt present for follow up of chronic health problems incl  DM2 HTN Hyperlipidemia    HTN bp is stable today  No cp or palpitations or headaches or edema  No side effects to medicines  BP Readings from Last 3 Encounters:  06/16/24 124/82  12/17/23 122/80  02/24/23 124/82    Lisinopril  10 mg daily  Metopsolol 50 mg daily   Lab Results  Component Value Date   NA 140 12/17/2023   K 5.2 12/17/2023   CO2 25 12/17/2023   GLUCOSE 98 12/17/2023   BUN 10 12/17/2023   CREATININE 0.94 12/17/2023   CALCIUM  9.7 12/17/2023   GFR 72.19 02/17/2023   GFRNONAA >60 08/03/2012    DM2 Diabetes Home sugar results   DM diet  Less processed foods/eats whole foods much of the time  Fights cravings     Exercise  Not a whole lot  Yard work Is active     No glycemic medication currently  On a statin and ace     A1c down a bit  Lab Results  Component Value Date   HGBA1C 6.6 (A) 06/16/2024   HGBA1C 6.8 (H) 12/17/2023   HGBA1C 6.8 (H) 02/17/2023   No results found for: LABMICR, MICROALBUR  Needs microalb   Renal protection- ace  Last eye exam  -sent for   Hyperlipidemia Lab Results  Component Value Date   CHOL 293 (H) 12/17/2023   HDL 55 12/17/2023   LDLCALC 195 (H) 12/17/2023   TRIG 230 (H) 12/17/2023   CHOLHDL 5.3 (H) 12/17/2023   Rosuvastatin  10 mg daily Was off of it for 4 months  Is back on med for a while   Smoking status  About the same 1/2 ppd Not ready to quit yet     Patient Active Problem List   Diagnosis Date Noted   Abnormal screening cardiac CT 12/27/2023   Family history of early CAD  12/17/2023   Current use of proton pump inhibitor 12/12/2023   Colon cancer screening 03/06/2021   Obstructive sleep apnea of adult 12/09/2017   Encounter for screening mammogram for breast cancer 09/15/2017   Morbid obesity (HCC) 07/19/2016   Routine general medical examination at a health care facility 02/14/2015   Depression with anxiety 02/26/2014   Type 2 diabetes mellitus without complications (HCC) 02/26/2014   Essential hypertension, benign 02/26/2014   Hyperlipidemia associated with type 2 diabetes mellitus (HCC) 02/26/2014   Smoker 02/26/2014   GERD (gastroesophageal reflux disease) 02/26/2014   Past Medical History:  Diagnosis Date   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  Substance Use Topics   Alcohol use: No    Alcohol/week: 0.0 standard drinks of alcohol   Drug use: No   Family History  Problem Relation Age of Onset   Arthritis Mother    Cancer Father        Prostate   Diabetes Father    Heart attack Father    Heart disease Father    Obesity Father    CAD Brother    Diabetes Paternal Grandfather    Diabetes Paternal Grandmother    ADD / ADHD Daughter    Anxiety disorder Daughter    Cancer Paternal Uncle    Heart attack Brother    Heart disease Brother    Early death Brother    Breast cancer Neg Hx    Allergies  Allergen Reactions   Atorvastatin      GI  side effects with the 80 mg    Ampicillin Rash   Penicillins Rash   Current Outpatient Medications on File Prior to Visit  Medication Sig Dispense Refill   Cholecalciferol 100 MCG (4000 UT) CAPS Take 4,000 Units by mouth daily.     clonazePAM  (KLONOPIN ) 1 MG tablet Take 1 tablet by mouth twice daily as needed for anxiety 60 tablet 0   Continuous Blood Gluc Receiver (FREESTYLE LIBRE 2 READER) DEVI Use as directed to monitor glucose for DM type 2 (dx. E11.9) 1 each 0   Continuous Blood Gluc Sensor (FREESTYLE LIBRE 2 SENSOR) MISC USE AS DIRECTED TO MONITOR GLUCOSE FOR TYPE 2 DM 6 each 0   escitalopram  (LEXAPRO ) 20 MG tablet Take 1 tablet (20 mg total) by mouth daily. 90 tablet 1   esomeprazole  (NEXIUM ) 20 MG capsule Take 20 mg by mouth daily at 12 noon.     lisinopril  (ZESTRIL ) 10 MG tablet Take 1 tablet (10 mg total) by mouth daily. 90 tablet 1   metoprolol  tartrate (LOPRESSOR ) 50 MG tablet Take 1 tablet (50 mg total) by mouth daily. 90 tablet 1   rosuvastatin  (CRESTOR ) 10 MG tablet Take 1 tablet (10 mg total) by mouth daily. 90 tablet 1   terconazole  (TERAZOL 3 ) 0.8 % vaginal cream Place 1 applicator vaginally at bedtime. To affected areas externally 20 g 0   No current facility-administered medications on file prior to visit.    Review of Systems  Constitutional:  Negative for activity change, appetite change, fatigue, fever and unexpected weight change.  HENT:  Negative for congestion, ear pain, rhinorrhea, sinus pressure and sore throat.   Eyes:  Negative for pain, redness and visual disturbance.  Respiratory:  Negative for cough, shortness of breath and wheezing.   Cardiovascular:  Negative for chest pain and palpitations.  Gastrointestinal:  Negative for abdominal pain, blood in stool, constipation and diarrhea.  Endocrine: Negative for polydipsia and polyuria.  Genitourinary:  Negative for dysuria, frequency and urgency.  Musculoskeletal:  Negative for arthralgias, back pain and  myalgias.  Skin:  Negative for pallor and rash.  Allergic/Immunologic: Negative for environmental allergies.  Neurological:  Negative for dizziness, syncope and headaches.  Hematological:  Negative for adenopathy. Does not bruise/bleed easily.  Psychiatric/Behavioral:  Positive for dysphoric mood. Negative for decreased concentration. The patient is not nervous/anxious.  Mood is stable       Objective:   Physical Exam Constitutional:      General: She is not in acute distress.    Appearance: Normal appearance. She is well-developed. She is obese. She is not ill-appearing or diaphoretic.  HENT:     Head: Normocephalic and atraumatic.  Eyes:     Conjunctiva/sclera: Conjunctivae normal.     Pupils: Pupils are equal, round, and reactive to light.  Neck:     Thyroid : No thyromegaly.     Vascular: No carotid bruit or JVD.  Cardiovascular:     Rate and Rhythm: Normal rate and regular rhythm.     Heart sounds: Normal heart sounds.     No gallop.  Pulmonary:     Effort: Pulmonary effort is normal. No respiratory distress.     Breath sounds: Normal breath sounds. No wheezing or rales.  Abdominal:     General: There is no distension or abdominal bruit.     Palpations: Abdomen is soft.  Musculoskeletal:     Cervical back: Normal range of motion and neck supple.     Right lower leg: No edema.     Left lower leg: No edema.  Lymphadenopathy:     Cervical: No cervical adenopathy.  Skin:    General: Skin is warm and dry.     Coloration: Skin is not pale.     Findings: No rash.  Neurological:     Mental Status: She is alert.     Coordination: Coordination normal.     Deep Tendon Reflexes: Reflexes are normal and symmetric. Reflexes normal.  Psychiatric:        Mood and Affect: Mood normal.           Assessment & Plan:   Problem List Items Addressed This Visit       Cardiovascular and Mediastinum   Essential hypertension, benign     bp in fair control at this time   BP Readings from Last 1 Encounters:  06/16/24 124/82   No changes needed Most recent labs reviewed  Disc lifstyle change with low sodium diet and exercise  Plan to continue  Lisinopril  10 mg daily  Metoprolol  50 mg daily  Lab today       Relevant Orders   Comprehensive metabolic panel with GFR     Endocrine   Type 2 diabetes mellitus without complications (HCC) - Primary   Lab Results  Component Value Date   HGBA1C 6.6 (A) 06/16/2024   HGBA1C 6.8 (H) 12/17/2023   HGBA1C 6.8 (H) 02/17/2023   Microalb ordered  Diet controlled-pt declines medicine  In future -glp-1 med may help with weight loss and impulsive eating  On ace and statin  Sent for eye exam report       Relevant Orders   POCT HgB A1C (Completed)   Microalbumin / creatinine urine ratio   Hyperlipidemia associated with type 2 diabetes mellitus (HCC)   Disc goals for lipids and reasons to control them Rev last labs with pt Rev low sat fat diet in detail Taking rosuvastatin  10 mg daily (back on this - last visit LDL up to 195 when not taking it)   Lab today      Relevant Orders   Comprehensive metabolic panel with GFR   Lipid panel     Other   Smoker   Disc in detail risks of smoking and possible outcomes including copd, vascular/ heart disease, cancer , respiratory and sinus infections as well  as osteoporosis  Pt voices understanding Pt is not ready to quit       Morbid obesity (HCC)   Discussed how this problem influences overall health and the risks it imposes  Reviewed plan for weight loss with lower calorie diet (via better food choices (lower glycemic and portion control) along with exercise building up to or more than 30 minutes 5 days per week including some aerobic activity and strength training    Pt is not currently interested in glp-1      Other Visit Diagnoses       Need for Td vaccine       Relevant Orders   Td : Tetanus/diphtheria >7yo Preservative  free (Completed)

## 2024-06-16 NOTE — Assessment & Plan Note (Signed)
 Disc in detail risks of smoking and possible outcomes including copd, vascular/ heart disease, cancer , respiratory and sinus infections as well as osteoporosis  Pt voices understanding  Pt is not ready to quit

## 2024-06-16 NOTE — Patient Instructions (Addendum)
 Stay active  Add some strength training to your routine, this is important for bone and brain health and can reduce your risk of falls and help your body use insulin properly and regulate weight  Light weights, exercise bands , and internet videos are a good way to start  Yoga (chair or regular), machines , floor exercises or a gym with machines are also good options   Tetanus shot (Td)   Labs for cholesterol today  Also urine protein    Keep working on diet and exercise   Diabetes is in control

## 2024-06-16 NOTE — Assessment & Plan Note (Signed)
 Discussed how this problem influences overall health and the risks it imposes  Reviewed plan for weight loss with lower calorie diet (via better food choices (lower glycemic and portion control) along with exercise building up to or more than 30 minutes 5 days per week including some aerobic activity and strength training    Pt is not currently interested in glp-1

## 2024-06-16 NOTE — Progress Notes (Signed)
 Cardiology Office Note:    Date:  06/16/2024   ID:  Rebekah Benton, DOB Mar 16, 1971, MRN [id number]  PCP:  Randeen Laine LABOR, MD   [facility] Providers Cardiologist:  None     Referring MD: Randeen Laine LABOR, MD   Chief Complaint  Patient presents with   Hypertension    Patient is doing well on today. Patient's right ankle seems more bigger than left ankle. Meds reviewed.    Rebekah Benton is a 53 y.o. female who is being seen today for the evaluation of coronary calcification at the request of Tower, Laine LABOR, MD.   History of Present Illness:    Rebekah Benton is a 53 y.o. female with a hx of hypertension, hyperlipidemia, coronary calcification current smoker x 20+ years, family history of early CAD who presents due to coronary calcification.  Had a coronary calcium  scan performed 1/25, calcium  score 55.8/95th percentile.  Was previously on Crestor , but this was stopped.  Restarted Crestor  6 months ago after lipid panel was abnormal.  She endorses having neck/jaw pain with exertion.  Denies shortness of breath.  Brother had a heart attack in his early 33s.  She still smokes.  States being compliant with Crestor  as prescribed, obtain a fasting lipid profile today.  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   clonazePAM  (KLONOPIN ) 1 MG tablet Take 1 tablet by mouth twice daily as needed for anxiety    Continuous Blood Gluc Receiver (FREESTYLE LIBRE 2 READER) DEVI Use as directed to monitor glucose for DM type 2 (dx. E11.9)   Continuous Blood Gluc Sensor (FREESTYLE LIBRE 2 SENSOR) MISC USE AS DIRECTED TO MONITOR GLUCOSE FOR TYPE 2 DM   escitalopram  (LEXAPRO ) 20 MG tablet Take 1 tablet (20 mg total) by mouth daily.   esomeprazole  (NEXIUM ) 20 MG capsule Take 20 mg by mouth daily at 12 noon.   lisinopril  (ZESTRIL ) 10 MG tablet Take 1 tablet (10 mg total) by mouth daily.   metoprolol  tartrate (LOPRESSOR ) 25 MG tablet Take 1 tablet (25 mg total) by mouth 2 (two) times daily.   rosuvastatin  (CRESTOR ) 10 MG tablet Take 1 tablet (10 mg total) by mouth daily.   terconazole  (TERAZOL 3 ) 0.8 % vaginal cream Place 1 applicator vaginally at bedtime. To affected areas externally   [DISCONTINUED] metoprolol  tartrate (LOPRESSOR ) 100 MG tablet TAKE 1 TABLET 2 HR PRIOR TO CARDIAC PROCEDURE   [DISCONTINUED] metoprolol  tartrate (LOPRESSOR ) 50 MG tablet Take 1 tablet (50 mg total) by mouth daily.     Allergies:   Atorvastatin , Ampicillin, and Penicillins   Social History   Socioeconomic History   Marital status: Single    Spouse name: Not on file   Number of children: Not on file   Years of education: Not on file   Highest education level: Associate degree: academic program  Occupational History  Not on file  Tobacco Use   Smoking status: Every Day    Current packs/day: 0.50    Types: Cigarettes   Smokeless tobacco: Never   Tobacco comments:    Pt smoke less than  half a pack a day.  Substance and Sexual Activity   Alcohol use: No    Alcohol/week: 0.0 standard drinks of alcohol   Drug use: No   Sexual activity: Not on file  Other Topics Concern   Not on file  Social History Narrative   Not on file   Social Drivers of Health   Financial Resource Strain: Medium Risk (12/16/2023)   Overall Financial Resource Strain (CARDIA)    Difficulty of Paying Living Expenses: Somewhat hard  Food  Insecurity: Patient Declined (12/16/2023)   Hunger Vital Sign    Worried About Running Out of Food in the Last Year: Patient declined    Ran Out of Food in the Last Year: Patient declined  Transportation Needs: No Transportation Needs (12/16/2023)   PRAPARE - Administrator, Civil Service (Medical): No    Lack of Transportation (Non-Medical): No  Physical Activity: Unknown (12/16/2023)   Exercise Vital Sign    Days of Exercise per Week: 0 days    Minutes of Exercise per Session: Not on file  Stress: Stress Concern Present (12/16/2023)   [employer] of Occupational Health - Occupational Stress Questionnaire    Feeling of Stress : Rather much  Social Connections: Unknown (12/16/2023)   Social Connection and Isolation Panel    Frequency of Communication with Friends and Family: Three times a week    Frequency of Social Gatherings with Friends and Family: Once a week    Attends Religious Services: Patient declined    Database administrator or Organizations: No    Attends Engineer, structural: Not on file    Marital Status: Patient declined     Family History: The patient's family history includes ADD / ADHD in her daughter; Anxiety disorder in her daughter; Arthritis in her mother; CAD in her brother; Cancer in her father and paternal uncle; Diabetes in her father, paternal grandfather, and paternal grandmother; Early death in her brother; Heart attack in her brother and father; Heart disease in her brother and father; Obesity in her father. There is no history of Breast cancer.  ROS:   Please see the history of present illness.     All other systems reviewed and are negative.  EKGs/Labs/Other Studies Reviewed:    The following studies were reviewed today:  EKG Interpretation Date/Time:  Friday June 16 2024 13:58:33 EDT Ventricular Rate:  78 PR Interval:  164 QRS Duration:  92 QT Interval:  392 QTC Calculation: 446 R Axis:   27  Text  Interpretation: Normal sinus rhythm Normal ECG Confirmed by Darliss Rogue ([id number]) on 06/16/2024 2:05:28 PM    Recent Labs: 12/17/2023: ALT 32; BUN 10; Creat 0.94; Hemoglobin 14.7; Platelets 288; Potassium 5.2; Sodium 140; TSH 1.68  Recent Lipid Panel    Component Value Date/Time   CHOL 293 (H) 12/17/2023 1436   TRIG 230 (H) 12/17/2023 1436   HDL 55 12/17/2023 1436   CHOLHDL 5.3 (H) 12/17/2023 1436   VLDL 20.0 02/17/2023 0733   LDLCALC 195 (H) 12/17/2023 1436     Risk Assessment/Calculations:             Physical Exam:    VS:  BP 126/72   Pulse 78   Ht 5' 6.75 (1.695 m)  Wt 262 lb 12.8 oz (119.2 kg)   SpO2 94%   BMI 41.47 kg/m     Wt Readings from Last 3 Encounters:  06/16/24 262 lb 12.8 oz (119.2 kg)  06/16/24 260 lb 6 oz (118.1 kg)  12/17/23 261 lb 8 oz (118.6 kg)     GEN:  Well nourished, well developed in no acute distress HEENT: Normal NECK: No JVD; No carotid bruits CARDIAC: RRR, no murmurs, rubs, gallops RESPIRATORY:  Clear to auscultation without rales, wheezing or rhonchi  ABDOMEN: Soft, non-tender, non-distended MUSCULOSKELETAL:  No edema; No deformity  SKIN: Warm and dry NEUROLOGIC:  Alert and oriented x 3 PSYCHIATRIC:  Normal affect   ASSESSMENT:    1. Anginal equivalent (HCC)   2. Primary hypertension   3. Mixed hyperlipidemia   4. Coronary artery calcification   5. Current smoker   6. Precordial pain    PLAN:    In order of problems listed above:  Jaw pain with exertion, this may be an anginal equivalent.  Family history of early CAD.  Coronary calcifications on calcium  scan earlier this year.  Obtain echocardiogram, obtain coronary CTA. Hypertension, BP controlled.  Continue lisinopril  10 mg daily, advised to take Lopressor  25 mg twice daily instead of daily. Hyperlipidemia, LDL cholesterol 195, total cholesterol 293 suggesting FH.  Continue Crestor  10 mg daily, follow-up lipid panel results today.  Titrate Crestor  if cholesterol not  adequately controlled. Coronary calcification noted on calcium  score 1/25.  Total calcium  score 55.8, 95th percentile.  Crestor  10 mg daily. Current smoker, smoking cessation advised.  Follow-up after cardiac testing       Medication Adjustments/Labs and Tests Ordered: Current medicines are reviewed at length with the patient today.  Concerns regarding medicines are outlined above.  Orders Placed This Encounter  Procedures   CT CORONARY MORPH W/CTA COR W/SCORE W/CA W/CM &/OR WO/CM   Basic metabolic panel with GFR   EKG 87-[place]ji   ECHOCARDIOGRAM COMPLETE   Meds ordered this encounter  Medications   DISCONTD: metoprolol  tartrate (LOPRESSOR ) 100 MG tablet    Sig: TAKE 1 TABLET 2 HR PRIOR TO CARDIAC PROCEDURE    Dispense:  1 tablet    Refill:  0   metoprolol  tartrate (LOPRESSOR ) 100 MG tablet    Sig: TAKE 1 TABLET 2 HR PRIOR TO CARDIAC PROCEDURE    Dispense:  1 tablet    Refill:  0   metoprolol  tartrate (LOPRESSOR ) 25 MG tablet    Sig: Take 1 tablet (25 mg total) by mouth 2 (two) times daily.    Dispense:  180 tablet    Refill:  3    Patient Instructions  Medication Instructions:  -TAKE ONE 100 mg metoprolol  2 hours prior to cardiac CTA -CHANGE metoprolol  to 25 mg twice a day   *If you need a refill on your cardiac medications before your next appointment, please call your pharmacy*  Lab Work: Your provider would like for you to have following labs drawn today bmp.   If you have labs (blood work) drawn today and your tests are completely normal, you will receive your results only by: MyChart Message (if you have MyChart) OR A paper copy in the mail If you have any lab test that is abnormal or we need to change your treatment, we will call you to review the results.  Testing/Procedures: Your physician has requested that you have an echocardiogram. Echocardiography is a painless test that uses sound waves to create images of your heart. It  provides your doctor with  information about the size and shape of your heart and how well your heart's chambers and valves are working.   You may receive an ultrasound enhancing agent through an IV if needed to better visualize your heart during the echo. This procedure takes approximately one hour.  There are no restrictions for this procedure.  This will take place at [address] [facility], [place] [address]  Please note: We ask at that you not bring children with you during ultrasound (echo/ vascular) testing. Due to room size and safety concerns, children are not allowed in the ultrasound rooms during exams. Our front office staff cannot provide observation of children in our lobby area while testing is being conducted. An adult accompanying a patient to their appointment will only be allowed in the ultrasound room at the discretion of the ultrasound technician under special circumstances. We apologize for any inconvenience.     Your cardiac CT will be scheduled at:  [facility] [address] [place], [place] [address] [phone]  Please arrive 15 mins early for check-in and test prep.  There is spacious parking and easy access to the radiology department from the [facility] entrance. Please enter here and check-in with the desk attendant.    Please follow these instructions carefully (unless otherwise directed):  An IV will be required for this test and Nitroglycerin  will be given.  Hold all erectile dysfunction medications at least 3 days (72 hrs) prior to test. (Ie viagra, cialis, sildenafil, tadalafil, etc)     On the Night Before the Test: Be sure to Drink plenty of water. Do not consume any caffeinated/decaffeinated beverages or chocolate 12 hours prior to your test. Do not take any antihistamines 12 hours prior to your test.  On the Day of the Test: Drink plenty of water until 1 hour prior to the test. Do not eat any food 1  hour prior to test. You may take your regular medications prior to the test.  Take metoprolol  (Lopressor ) two hours prior to test. If you take Furosemide /Hydrochlorothiazide/Spironolactone/Chlorthalidone, please HOLD on the morning of the test. Patients who wear a continuous glucose monitor MUST remove the device prior to scanning. FEMALES- please wear underwire-free bra if available, avoid dresses & tight clothing       After the Test: Drink plenty of water. After receiving IV contrast, you may experience a mild flushed feeling. This is normal. On occasion, you may experience a mild rash up to 24 hours after the test. This is not dangerous. If this occurs, you can take Benadryl 25 mg, Zyrtec, Claritin, or Allegra and increase your fluid intake. (Patients taking Tikosyn should avoid Benadryl, and may take Zyrtec, Claritin, or Allegra) If you experience trouble breathing, this can be serious. If it is severe call 911 IMMEDIATELY. If it is mild, please call our office.  We will call to schedule your test 2-4 weeks out understanding that some insurance companies will need an authorization prior to the service being performed.   For more information and frequently asked questions, please visit our website : [email or web address]  For non-scheduling related questions, please contact the cardiac imaging nurse navigator should you have any questions/concerns: Cardiac Imaging Nurse Navigators Direct Office Dial: [phone]   For scheduling needs, including cancellations and rescheduling, please call [place], [phone].    Follow-Up: At [facility], you and your health needs are our priority.  As part of our continuing  mission to provide you with exceptional heart care, our providers are all part of one team.  This team includes your primary Cardiologist (physician) and Advanced Practice Providers or APPs (Physician Assistants and Nurse Practitioners) who all work together  to provide you with the care you need, when you need it.  Your next appointment:   3 month(s)  Provider:   You may see Dr. Darliss or one of the following Advanced Practice Providers on your designated Care Team:   Lonni Meager, NP Lesley Maffucci, PA-C Bernardino Bring, PA-C Cadence Alliance, PA-C Tylene Lunch, NP Barnie Hila, NP    We recommend signing up for the patient portal called MyChart.  Sign up information is provided on this After Visit Summary.  MyChart is used to connect with patients for Virtual Visits (Telemedicine).  Patients are able to view lab/test results, encounter notes, upcoming appointments, etc.  Non-urgent messages can be sent to your provider as well.   To learn more about what you can do with MyChart, go to [email or web address].          Signed, Redell Darliss, MD  06/16/2024 4:17 PM    [facility]

## 2024-06-17 LAB — BASIC METABOLIC PANEL WITH GFR
BUN/Creatinine Ratio: 14 (ref 9–23)
BUN: 13 mg/dL (ref 6–24)
CO2: 21 mmol/L (ref 20–29)
Calcium: 9.3 mg/dL (ref 8.7–10.2)
Chloride: 107 mmol/L — ABNORMAL HIGH (ref 96–106)
Creatinine, Ser: 0.9 mg/dL (ref 0.57–1.00)
Glucose: 191 mg/dL — ABNORMAL HIGH (ref 70–99)
Potassium: 4.5 mmol/L (ref 3.5–5.2)
Sodium: 143 mmol/L (ref 134–144)
eGFR: 77 mL/min/1.73 (ref >59)

## 2024-06-18 ENCOUNTER — Ambulatory Visit: Payer: Self-pay | Admitting: Family Medicine

## 2024-07-11 ENCOUNTER — Other Ambulatory Visit: Payer: Self-pay | Admitting: Family Medicine

## 2024-07-11 NOTE — Telephone Encounter (Signed)
 Name of Medication: Klonopin   Name of Pharmacy: [facility] [address] Last Fill or Written Date and Quantity: 11/26/23 #60 tab/ 0 refill Last Office Visit and Type: f/u on 06/16/24 Next Office Visit and Type: none scheduled

## 2024-08-03 ENCOUNTER — Ambulatory Visit: Attending: Cardiology

## 2024-08-03 DIAGNOSIS — I10 Essential (primary) hypertension: Secondary | ICD-10-CM | POA: Diagnosis not present

## 2024-08-04 ENCOUNTER — Ambulatory Visit: Payer: Self-pay | Admitting: Cardiology

## 2024-08-04 LAB — ECHOCARDIOGRAM COMPLETE
AV Mean grad: 8 mmHg
AV Peak grad: 14.3 mmHg
Ao pk vel: 1.89 m/s
Area-P 1/2: 3.65 cm2
S' Lateral: 2.41 cm

## 2024-08-22 ENCOUNTER — Encounter (HOSPITAL_COMMUNITY): Payer: Self-pay

## 2024-08-24 ENCOUNTER — Ambulatory Visit
Admission: RE | Admit: 2024-08-24 | Discharge: 2024-08-24 | Disposition: A | Discharge status: Home / Self Care | Source: Ambulatory Visit | Attending: Cardiology | Admitting: Cardiology

## 2024-08-24 DIAGNOSIS — R072 Precordial pain: Secondary | ICD-10-CM | POA: Insufficient documentation

## 2024-08-24 LAB — POCT I-STAT CREATININE: Creatinine, Ser: 1 mg/dL (ref 0.44–1.00)

## 2024-08-24 MED ORDER — NITROGLYCERIN 0.4 MG SL SUBL
0.8000 mg | SUBLINGUAL_TABLET | Freq: Once | SUBLINGUAL | Status: AC
  Administered 2024-08-24: 0.8 mg via SUBLINGUAL
  Filled 2024-08-24: qty 25

## 2024-08-24 MED ORDER — NITROGLYCERIN 0.4 MG SL SUBL
SUBLINGUAL_TABLET | SUBLINGUAL | Status: AC
  Filled 2024-08-24: qty 2

## 2024-08-24 MED ORDER — IOHEXOL 350 MG/ML SOLN
100.0000 mL | Freq: Once | INTRAVENOUS | Status: AC | PRN
  Administered 2024-08-24: 100 mL via INTRAVENOUS

## 2024-08-24 NOTE — Progress Notes (Signed)
 Patient tolerated procedure well. W/C to lobby.  Ambulate w/o difficulty. Denies light headedness or being dizzy. Encouraged to drink extra water today and reasoning explained. Verbalized understanding. All questions answered. ABC intact. No further needs. Discharge from procedure area w/o issues.

## 2024-08-25 ENCOUNTER — Other Ambulatory Visit: Payer: Self-pay

## 2024-08-25 MED ORDER — ROSUVASTATIN CALCIUM 20 MG PO TABS: 20.0000 mg | ORAL_TABLET | Freq: Every day | ORAL | 3 refills | Status: DC

## 2024-08-27 ENCOUNTER — Encounter: Payer: Self-pay | Admitting: Family Medicine

## 2024-08-29 ENCOUNTER — Ambulatory Visit: Admitting: Family Medicine

## 2024-09-15 ENCOUNTER — Ambulatory Visit: Admitting: Cardiology

## 2024-10-23 ENCOUNTER — Other Ambulatory Visit: Payer: Self-pay | Admitting: Family Medicine

## 2024-10-31 ENCOUNTER — Other Ambulatory Visit: Payer: Self-pay | Admitting: Cardiology

## 2024-10-31 NOTE — Telephone Encounter (Signed)
Please contact pt for future appointment. Pt overdue for follow up.

## 2024-10-31 NOTE — Telephone Encounter (Signed)
 Called to schedule fu not available

## 2024-11-06 ENCOUNTER — Other Ambulatory Visit: Payer: Self-pay | Admitting: Family Medicine

## 2024-11-08 NOTE — Telephone Encounter (Signed)
 Called LVM

## 2024-11-14 ENCOUNTER — Other Ambulatory Visit: Payer: Self-pay | Admitting: Cardiology

## 2024-11-14 ENCOUNTER — Other Ambulatory Visit: Payer: Self-pay | Admitting: Family Medicine

## 2024-11-15 NOTE — Telephone Encounter (Signed)
Please contact pt for future appointment. Pt overdue for follow up.

## 2024-11-16 ENCOUNTER — Encounter: Payer: Self-pay | Admitting: Family Medicine

## 2024-11-16 NOTE — Telephone Encounter (Signed)
 Unable to leave vm.

## 2024-11-16 NOTE — Telephone Encounter (Signed)
 Unable to leave voicemail.

## 2024-11-17 ENCOUNTER — Telehealth: Payer: Self-pay | Admitting: *Deleted

## 2024-11-17 NOTE — Telephone Encounter (Signed)
 Thanks There is a list of medicines but no diagnoses or notes (? If they are able to release that or not)

## 2024-11-17 NOTE — Telephone Encounter (Signed)
 Pt sent a message saying:  Hi Dr. Randeen! I have attached a zip file from [facility] with a diagnosis. Could you please add this to my chart/records? Thank you and have a great Gertha Christmas!   **office notes under media for you to review*

## 2024-11-20 NOTE — Telephone Encounter (Signed)
 Sent mychart asking pt Dr. Graham question

## 2024-12-26 NOTE — Telephone Encounter (Signed)
 Left vm

## 2024-12-28 ENCOUNTER — Encounter: Payer: Self-pay | Admitting: Family Medicine

## 2024-12-28 DIAGNOSIS — F909 Attention-deficit hyperactivity disorder, unspecified type: Secondary | ICD-10-CM | POA: Insufficient documentation

## 2025-02-16 ENCOUNTER — Other Ambulatory Visit

## 2025-02-23 ENCOUNTER — Encounter: Admitting: Family Medicine
